# Patient Record
Sex: Female | Born: 1950 | Race: Black or African American | Hispanic: No | Marital: Single | Smoking: Never smoker
Health system: Southern US, Community
[De-identification: ages and names within clinical notes are randomized; demographics above are authoritative.]

## PROBLEM LIST (undated history)

## (undated) DIAGNOSIS — M1990 Unspecified osteoarthritis, unspecified site: Secondary | ICD-10-CM

---

## 2002-06-11 ENCOUNTER — Ambulatory Visit (HOSPITAL_COMMUNITY): Admission: RE | Admit: 2002-06-11 | Discharge: 2002-06-11 | Payer: Self-pay | Admitting: Family Medicine

## 2020-12-22 DIAGNOSIS — M25561 Pain in right knee: Secondary | ICD-10-CM | POA: Diagnosis not present

## 2020-12-22 DIAGNOSIS — M545 Low back pain, unspecified: Secondary | ICD-10-CM | POA: Diagnosis not present

## 2020-12-22 DIAGNOSIS — M1711 Unilateral primary osteoarthritis, right knee: Secondary | ICD-10-CM | POA: Diagnosis not present

## 2020-12-24 ENCOUNTER — Ambulatory Visit (INDEPENDENT_AMBULATORY_CARE_PROVIDER_SITE_OTHER): Payer: PPO

## 2020-12-24 ENCOUNTER — Encounter: Payer: Self-pay | Admitting: Orthopedic Surgery

## 2020-12-24 ENCOUNTER — Ambulatory Visit (INDEPENDENT_AMBULATORY_CARE_PROVIDER_SITE_OTHER): Payer: PPO | Admitting: Orthopedic Surgery

## 2020-12-24 DIAGNOSIS — M25531 Pain in right wrist: Secondary | ICD-10-CM | POA: Diagnosis not present

## 2020-12-24 DIAGNOSIS — L97211 Non-pressure chronic ulcer of right calf limited to breakdown of skin: Secondary | ICD-10-CM | POA: Diagnosis not present

## 2020-12-24 DIAGNOSIS — I83012 Varicose veins of right lower extremity with ulcer of calf: Secondary | ICD-10-CM

## 2020-12-24 DIAGNOSIS — I872 Venous insufficiency (chronic) (peripheral): Secondary | ICD-10-CM | POA: Diagnosis not present

## 2020-12-24 DIAGNOSIS — M25331 Other instability, right wrist: Secondary | ICD-10-CM

## 2020-12-24 NOTE — Progress Notes (Signed)
Office Visit Note   Patient: Tammy Larsen           Date of Birth: 17-Aug-1951           MRN: [id number] Visit Date: 12/24/2020              Requested by: No referring provider defined for this encounter. PCP: No primary care provider on file.  Chief Complaint  Patient presents with  . Right Leg - Open Wound  . Right Wrist - Injury      HPI: Patient is a 69 year old woman who is seen for chronic venous insufficiency ulcer worse on the right leg than the left.  Patient states she has worn compression stockings for years.  Patient also complains of right wrist pain she denies any traumatic injury.  Patient denies a history of gout.   Assessment & Plan: Visit Diagnoses:  1. Pain in right wrist   2. Scapho-lunate dissociation, right   3. Venous stasis ulcer of right calf limited to breakdown of skin with varicose veins (HCC)     Plan: We will place her in a long-arm Velcro splint on the right to support the wrist.  We will apply Dynaflex wraps to both legs follow-up next week to change the wraps with eventually change her into compression stockings.    Follow-Up Instructions: No follow-ups on file.   Ortho Exam  Patient is alert, oriented, no adenopathy, well-dressed, normal affect, normal respiratory effort. Examination the right wrist patient has no active flexion or extension of the wrist.  She does not have any point tenderness around the wrist the scapholunate interval is minimally tender to palpation.  The scaphoid is nontender to palpation.  Examination of both legs patient has chronic venous insufficiency with brawny skin color changes there is a venous ulcer on the medial aspect of the right calf which is 3 cm in diameter there is granulation tissue at the base with no exposed bone or tendon no cellulitis.  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  Patient has a strong dorsalis pedis pulse bilaterally no arterial insufficiency.  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  No images are attached to the encounter.  Labs: No results found for: HGBA1C, ESRSEDRATE, CRP, LABURIC, REPTSTATUS, GRAMSTAIN, CULT, LABORGA   No results found for: ALBUMIN, PREALBUMIN, LABURIC  No results found for: MG No results found for: VD25OH  No results found for: PREALBUMIN No flowsheet data found.   There is no height or weight on file to calculate BMI.  Orders:  Orders Placed This Encounter  Procedures  . XR Wrist 2 Views Right   No orders of the defined types were placed in this encounter.    Procedures: No procedures performed  Clinical Data: No additional findings.  ROS:  All other systems negative, except as noted in the HPI. Review of Systems  Objective: Vital Signs: There were no vitals taken for this visit.  Specialty Comments:  No specialty comments available.  PMFS History: There are no problems to display for this patient.  History reviewed. No pertinent past medical history.  History reviewed. No pertinent family history.  History reviewed. No pertinent surgical history. Social History   Occupational History  . Not on file  Tobacco Use  . Smoking status: Not on file  . Smokeless tobacco: Not on  file  Substance and Sexual Activity  . Alcohol use: Not on file  . Drug use: Not on file  . Sexual activity: Not on file

## 2020-12-31 ENCOUNTER — Other Ambulatory Visit: Payer: Self-pay

## 2020-12-31 ENCOUNTER — Ambulatory Visit: Payer: PPO | Admitting: Physician Assistant

## 2020-12-31 ENCOUNTER — Encounter: Payer: Self-pay | Admitting: Orthopedic Surgery

## 2020-12-31 DIAGNOSIS — M25531 Pain in right wrist: Secondary | ICD-10-CM | POA: Diagnosis not present

## 2020-12-31 NOTE — Progress Notes (Signed)
   Office Visit Note   Patient: Tammy Larsen           Date of Birth: 1951/01/14           MRN: [id number] Visit Date: 12/31/2020              Requested by: No referring provider defined for this encounter. PCP: No primary care provider on file.  Chief Complaint  Patient presents with  . Left Leg - Follow-up  . Right Leg - Follow-up  . Right Wrist - Follow-up  . Lower Back - Wound Check      HPI: Patient is a pleasant 70 year old woman who we have been wrapping bilateral lower extremities.  She is here with her family.  Also has been treated for a scapholunate dissociation on the right.  She has tolerated the wraps well.  She does feel better in the Velcro wrist splint  Assessment & Plan: Visit Diagnoses: No diagnosis found.  Plan: We will be wrapped 1 more time.  Was measured for large vive compression stockings.  We will continue to wear the splint with new x-rays next week.  We will also remove the wraps early next week in place compression stockings on her lower extremities  Follow-Up Instructions: No follow-ups on file.   Ortho Exam  Patient is alert, oriented, no adenopathy, well-dressed, normal affect, normal respiratory effort. Bilateral lower extremities significant decrease in swelling.  She does have some resolving ulcers.  There is no foul odor no drainage.  No signs of necrosis or cellulitis.  Right wrist has some improvement with regards to pain still has limited to no active flexion or extension of the wrist.  No specific tenderness  Imaging: No results found. No images are attached to the encounter.  Labs: No results found for: HGBA1C, ESRSEDRATE, CRP, LABURIC, REPTSTATUS, GRAMSTAIN, CULT, LABORGA   No results found for: ALBUMIN, PREALBUMIN, LABURIC  No results found for: MG No results found for: VD25OH  No results found for: PREALBUMIN No flowsheet data found.   There is no height or weight on file to calculate BMI.  Orders:  No orders of the  defined types were placed in this encounter.  No orders of the defined types were placed in this encounter.    Procedures: No procedures performed  Clinical Data: No additional findings.  ROS:  All other systems negative, except as noted in the HPI. Review of Systems  Objective: Vital Signs: There were no vitals taken for this visit.  Specialty Comments:  No specialty comments available.  PMFS History: There are no problems to display for this patient.  No past medical history on file.  No family history on file.  No past surgical history on file. Social History   Occupational History  . Not on file  Tobacco Use  . Smoking status: Not on file  . Smokeless tobacco: Not on file  Substance and Sexual Activity  . Alcohol use: Not on file  . Drug use: Not on file  . Sexual activity: Not on file

## 2021-01-06 DIAGNOSIS — M1711 Unilateral primary osteoarthritis, right knee: Secondary | ICD-10-CM | POA: Diagnosis not present

## 2021-01-06 DIAGNOSIS — M25561 Pain in right knee: Secondary | ICD-10-CM | POA: Diagnosis not present

## 2021-01-08 ENCOUNTER — Ambulatory Visit: Payer: PPO | Admitting: Physician Assistant

## 2021-01-08 ENCOUNTER — Other Ambulatory Visit: Payer: Self-pay

## 2021-01-08 ENCOUNTER — Encounter: Payer: Self-pay | Admitting: Physician Assistant

## 2021-01-08 ENCOUNTER — Ambulatory Visit: Payer: Self-pay

## 2021-01-08 DIAGNOSIS — M25531 Pain in right wrist: Secondary | ICD-10-CM | POA: Diagnosis not present

## 2021-01-08 NOTE — Progress Notes (Signed)
   Office Visit Note   Patient: Tammy Larsen           Date of Birth: 16-Jul-1951           MRN: [id number] Visit Date: 01/08/2021              Requested by: No referring provider defined for this encounter. PCP: No primary care provider on file.  Chief Complaint  Patient presents with  . Right Leg - Follow-up  . Left Leg - Follow-up  . Right Wrist - Follow-up      HPI: Patient is a pleasant 70 year old woman who follows up today for her bilateral lower extremity venous stasis disease.  Also follows up for her right wrist scapholunate disassociation.  She is wondering if her right wrist injury is related to twisting her wrist when she was using her walker.  She has been in an immobilizer and actually feels much better.  She is right-handed.  She has brought her socks today to be applied  Assessment & Plan: Visit Diagnoses:  1. Pain in right wrist     Plan: Discussed scaphoid lunate dissociation with Dr. Erlinda Hong.  She is not particularly tender today she says her wrist just feels stiff.  We have recommended weaning out of the splint and beginning occupational therapy.  If she had pain a wrist injection could be done.  Surgically the option for her with carpectomy but only a name terribly painful they would like to go forward with occupational therapy.  Follow-up in 3 weeks  Follow-Up Instructions: No follow-ups on file.   Ortho Exam  Patient is alert, oriented, no adenopathy, well-dressed, normal affect, normal respiratory effort. Right wrist nontender to palpation she does have weak grip strength.  No swelling she is able to extend and flex her wrist though moderately stiff.  No soft tissue swelling Bilateral lower extremities no open ulcers significant decrease in swelling with wrinkling of the skin and no cellulitis or signs of infection  Imaging: No results found. No images are attached to the encounter.  Labs: No results found for: HGBA1C, ESRSEDRATE, CRP, LABURIC, REPTSTATUS,  GRAMSTAIN, CULT, LABORGA   No results found for: ALBUMIN, PREALBUMIN, LABURIC  No results found for: MG No results found for: VD25OH  No results found for: PREALBUMIN No flowsheet data found.   There is no height or weight on file to calculate BMI.  Orders:  Orders Placed This Encounter  Procedures  . XR Wrist Complete Right  . Ambulatory referral to Occupational Therapy   No orders of the defined types were placed in this encounter.    Procedures: No procedures performed  Clinical Data: No additional findings.  ROS:  All other systems negative, except as noted in the HPI. Review of Systems  Objective: Vital Signs: There were no vitals taken for this visit.  Specialty Comments:  No specialty comments available.  PMFS History: There are no problems to display for this patient.  No past medical history on file.  No family history on file.  No past surgical history on file. Social History   Occupational History  . Not on file  Tobacco Use  . Smoking status: Not on file  . Smokeless tobacco: Not on file  Substance and Sexual Activity  . Alcohol use: Not on file  . Drug use: Not on file  . Sexual activity: Not on file

## 2021-01-20 DIAGNOSIS — I8390 Asymptomatic varicose veins of unspecified lower extremity: Secondary | ICD-10-CM | POA: Diagnosis not present

## 2021-01-20 DIAGNOSIS — Z9181 History of falling: Secondary | ICD-10-CM | POA: Diagnosis not present

## 2021-01-20 DIAGNOSIS — M545 Low back pain, unspecified: Secondary | ICD-10-CM | POA: Diagnosis not present

## 2021-01-20 DIAGNOSIS — M1711 Unilateral primary osteoarthritis, right knee: Secondary | ICD-10-CM | POA: Diagnosis not present

## 2021-01-27 DIAGNOSIS — Z9181 History of falling: Secondary | ICD-10-CM | POA: Diagnosis not present

## 2021-01-27 DIAGNOSIS — M545 Low back pain, unspecified: Secondary | ICD-10-CM | POA: Diagnosis not present

## 2021-01-27 DIAGNOSIS — I8390 Asymptomatic varicose veins of unspecified lower extremity: Secondary | ICD-10-CM | POA: Diagnosis not present

## 2021-01-27 DIAGNOSIS — M1711 Unilateral primary osteoarthritis, right knee: Secondary | ICD-10-CM | POA: Diagnosis not present

## 2021-01-28 ENCOUNTER — Ambulatory Visit: Payer: PPO | Admitting: Physician Assistant

## 2021-01-28 ENCOUNTER — Encounter: Payer: Self-pay | Admitting: Physician Assistant

## 2021-01-28 DIAGNOSIS — M25531 Pain in right wrist: Secondary | ICD-10-CM | POA: Diagnosis not present

## 2021-01-28 NOTE — Progress Notes (Signed)
   Office Visit Note   Patient: Tammy Larsen           Date of Birth: 1951-02-27           MRN: [id number] Visit Date: 01/28/2021              Requested by: No referring provider defined for this encounter. PCP: No primary care provider on file.  Chief Complaint  Patient presents with  . Right Wrist - Follow-up    F/u for right wrist scapholunate dissociation      HPI: Patient presents in follow-up today for her right wrist scapholunate disassociation.  Also for her bilateral lower extremity venous insufficiency.  She has been wearing compression socks and has noticed improvement.  Her wrist does not really hurt her very much at all.  Family is wondering if she can do home health occupational therapy for the wrist at the same time she does hurt me therapy  Assessment & Plan: Visit Diagnoses: No diagnosis found.  Plan: She is improving we will follow-up in 3 weeks for her lower extremities.  Continue with compression socks.  Will write for occupational therapy at home for her wrist  Follow-Up Instructions: No follow-ups on file.   Ortho Exam  Patient is alert, oriented, no adenopathy, well-dressed, normal affect, normal respiratory effort. Right wrist no swelling she but has good extension flexion good grip strength.  Nontender to palpation. Bilateral lower extremities socks are doing an excellent job debriding skin she has no open ulcers.  No cellulitis decreased swelling no evidence of infection  Imaging: No results found. No images are attached to the encounter.  Labs: No results found for: HGBA1C, ESRSEDRATE, CRP, LABURIC, REPTSTATUS, GRAMSTAIN, CULT, LABORGA   No results found for: ALBUMIN, PREALBUMIN, LABURIC  No results found for: MG No results found for: VD25OH  No results found for: PREALBUMIN No flowsheet data found.   There is no height or weight on file to calculate BMI.  Orders:  No orders of the defined types were placed in this encounter.  No  orders of the defined types were placed in this encounter.    Procedures: No procedures performed  Clinical Data: No additional findings.  ROS:  All other systems negative, except as noted in the HPI. Review of Systems  Objective: Vital Signs: There were no vitals taken for this visit.  Specialty Comments:  No specialty comments available.  PMFS History: There are no problems to display for this patient.  History reviewed. No pertinent past medical history.  History reviewed. No pertinent family history.  History reviewed. No pertinent surgical history. Social History   Occupational History  . Not on file  Tobacco Use  . Smoking status: Not on file  . Smokeless tobacco: Not on file  Substance and Sexual Activity  . Alcohol use: Not on file  . Drug use: Not on file  . Sexual activity: Not on file

## 2021-02-03 ENCOUNTER — Encounter (HOSPITAL_COMMUNITY): Payer: Self-pay

## 2021-02-03 ENCOUNTER — Emergency Department (HOSPITAL_COMMUNITY): Payer: PPO

## 2021-02-03 ENCOUNTER — Inpatient Hospital Stay (HOSPITAL_COMMUNITY)
Admission: EM | Admit: 2021-02-03 | Discharge: 2021-02-19 | DRG: 054 | Disposition: A | Discharge status: Skilled Nursing Facility | Payer: PPO | Attending: Internal Medicine | Admitting: Internal Medicine

## 2021-02-03 DIAGNOSIS — C7951 Secondary malignant neoplasm of bone: Secondary | ICD-10-CM | POA: Diagnosis present

## 2021-02-03 DIAGNOSIS — D72829 Elevated white blood cell count, unspecified: Secondary | ICD-10-CM | POA: Diagnosis not present

## 2021-02-03 DIAGNOSIS — Z20822 Contact with and (suspected) exposure to covid-19: Secondary | ICD-10-CM | POA: Diagnosis present

## 2021-02-03 DIAGNOSIS — M1711 Unilateral primary osteoarthritis, right knee: Secondary | ICD-10-CM | POA: Diagnosis present

## 2021-02-03 DIAGNOSIS — R569 Unspecified convulsions: Secondary | ICD-10-CM | POA: Diagnosis not present

## 2021-02-03 DIAGNOSIS — R5381 Other malaise: Secondary | ICD-10-CM | POA: Diagnosis not present

## 2021-02-03 DIAGNOSIS — C7800 Secondary malignant neoplasm of unspecified lung: Secondary | ICD-10-CM | POA: Diagnosis present

## 2021-02-03 DIAGNOSIS — Z515 Encounter for palliative care: Secondary | ICD-10-CM

## 2021-02-03 DIAGNOSIS — C801 Malignant (primary) neoplasm, unspecified: Secondary | ICD-10-CM | POA: Diagnosis not present

## 2021-02-03 DIAGNOSIS — Z7189 Other specified counseling: Secondary | ICD-10-CM | POA: Diagnosis not present

## 2021-02-03 DIAGNOSIS — N6312 Unspecified lump in the right breast, upper inner quadrant: Secondary | ICD-10-CM | POA: Diagnosis not present

## 2021-02-03 DIAGNOSIS — M6281 Muscle weakness (generalized): Secondary | ICD-10-CM | POA: Diagnosis not present

## 2021-02-03 DIAGNOSIS — C50311 Malignant neoplasm of lower-inner quadrant of right female breast: Secondary | ICD-10-CM | POA: Diagnosis not present

## 2021-02-03 DIAGNOSIS — G9389 Other specified disorders of brain: Secondary | ICD-10-CM

## 2021-02-03 DIAGNOSIS — R0902 Hypoxemia: Secondary | ICD-10-CM | POA: Diagnosis not present

## 2021-02-03 DIAGNOSIS — D638 Anemia in other chronic diseases classified elsewhere: Secondary | ICD-10-CM | POA: Diagnosis not present

## 2021-02-03 DIAGNOSIS — R918 Other nonspecific abnormal finding of lung field: Secondary | ICD-10-CM

## 2021-02-03 DIAGNOSIS — R54 Age-related physical debility: Secondary | ICD-10-CM | POA: Diagnosis present

## 2021-02-03 DIAGNOSIS — Z171 Estrogen receptor negative status [ER-]: Secondary | ICD-10-CM | POA: Diagnosis not present

## 2021-02-03 DIAGNOSIS — R202 Paresthesia of skin: Secondary | ICD-10-CM | POA: Diagnosis not present

## 2021-02-03 DIAGNOSIS — C7981 Secondary malignant neoplasm of breast: Secondary | ICD-10-CM | POA: Diagnosis present

## 2021-02-03 DIAGNOSIS — C7931 Secondary malignant neoplasm of brain: Principal | ICD-10-CM

## 2021-02-03 DIAGNOSIS — R531 Weakness: Secondary | ICD-10-CM | POA: Diagnosis not present

## 2021-02-03 DIAGNOSIS — M1990 Unspecified osteoarthritis, unspecified site: Secondary | ICD-10-CM | POA: Diagnosis not present

## 2021-02-03 DIAGNOSIS — C799 Secondary malignant neoplasm of unspecified site: Secondary | ICD-10-CM

## 2021-02-03 DIAGNOSIS — C50811 Malignant neoplasm of overlapping sites of right female breast: Secondary | ICD-10-CM | POA: Diagnosis present

## 2021-02-03 DIAGNOSIS — N858 Other specified noninflammatory disorders of uterus: Secondary | ICD-10-CM | POA: Diagnosis not present

## 2021-02-03 DIAGNOSIS — C50911 Malignant neoplasm of unspecified site of right female breast: Secondary | ICD-10-CM | POA: Diagnosis not present

## 2021-02-03 DIAGNOSIS — R000 Tachycardia, unspecified: Secondary | ICD-10-CM | POA: Diagnosis not present

## 2021-02-03 DIAGNOSIS — G936 Cerebral edema: Secondary | ICD-10-CM | POA: Diagnosis present

## 2021-02-03 DIAGNOSIS — Z880 Allergy status to penicillin: Secondary | ICD-10-CM

## 2021-02-03 DIAGNOSIS — M159 Polyosteoarthritis, unspecified: Secondary | ICD-10-CM | POA: Diagnosis not present

## 2021-02-03 DIAGNOSIS — T380X5A Adverse effect of glucocorticoids and synthetic analogues, initial encounter: Secondary | ICD-10-CM | POA: Diagnosis not present

## 2021-02-03 DIAGNOSIS — M2550 Pain in unspecified joint: Secondary | ICD-10-CM | POA: Diagnosis not present

## 2021-02-03 DIAGNOSIS — R29810 Facial weakness: Secondary | ICD-10-CM | POA: Diagnosis not present

## 2021-02-03 DIAGNOSIS — R9431 Abnormal electrocardiogram [ECG] [EKG]: Secondary | ICD-10-CM | POA: Diagnosis not present

## 2021-02-03 DIAGNOSIS — Z7401 Bed confinement status: Secondary | ICD-10-CM | POA: Diagnosis not present

## 2021-02-03 HISTORY — DX: Unspecified osteoarthritis, unspecified site: M19.90

## 2021-02-03 LAB — CBC
HCT: 29.6 % — ABNORMAL LOW (ref 36.0–46.0)
HCT: 32.6 % — ABNORMAL LOW (ref 36.0–46.0)
Hemoglobin: 9.4 g/dL — ABNORMAL LOW (ref 12.0–15.0)
Hemoglobin: 10 g/dL — ABNORMAL LOW (ref 12.0–15.0)
MCH: 22.1 pg — ABNORMAL LOW (ref 26.0–34.0)
MCH: 21.7 pg — ABNORMAL LOW (ref 26.0–34.0)
MCHC: 31.8 g/dL (ref 30.0–36.0)
MCHC: 30.7 g/dL (ref 30.0–36.0)
MCV: 69.6 fL — ABNORMAL LOW (ref 80.0–100.0)
MCV: 70.7 fL — ABNORMAL LOW (ref 80.0–100.0)
Platelets: 289 10*3/uL (ref 150–400)
Platelets: 324 10*3/uL (ref 150–400)
RBC: 4.25 MIL/uL (ref 3.87–5.11)
RBC: 4.61 MIL/uL (ref 3.87–5.11)
RDW: 15.8 % — ABNORMAL HIGH (ref 11.5–15.5)
RDW: 15.9 % — ABNORMAL HIGH (ref 11.5–15.5)
WBC: 8.4 10*3/uL (ref 4.0–10.5)
WBC: 8.1 10*3/uL (ref 4.0–10.5)
nRBC: 0 % (ref 0.0–0.2)
nRBC: 0 % (ref 0.0–0.2)

## 2021-02-03 LAB — BASIC METABOLIC PANEL
Anion gap: 13 (ref 5–15)
BUN: 13 mg/dL (ref 8–23)
CO2: 26 mmol/L (ref 22–32)
Calcium: 9.5 mg/dL (ref 8.9–10.3)
Chloride: 101 mmol/L (ref 98–111)
Creatinine, Ser: 0.76 mg/dL (ref 0.44–1.00)
GFR, Estimated: >60 mL/min (ref >60)
Glucose, Bld: 105 mg/dL — ABNORMAL HIGH (ref 70–99)
Potassium: 4.1 mmol/L (ref 3.5–5.1)
Sodium: 140 mmol/L (ref 135–145)

## 2021-02-03 LAB — CREATININE, SERUM
Creatinine, Ser: 0.69 mg/dL (ref 0.44–1.00)
GFR, Estimated: >60 mL/min (ref >60)

## 2021-02-03 MED ORDER — ONDANSETRON HCL 4 MG/2ML IJ SOLN: 4.0000 mg | Freq: Four times a day (QID) | INTRAMUSCULAR | Status: DC | PRN

## 2021-02-03 MED ORDER — DEXAMETHASONE SODIUM PHOSPHATE 10 MG/ML IJ SOLN
10.0000 mg | Freq: Once | INTRAMUSCULAR | Status: AC
  Administered 2021-02-03: 10 mg via INTRAVENOUS
  Filled 2021-02-03: qty 1

## 2021-02-03 MED ORDER — IOHEXOL 300 MG/ML  SOLN
75.0000 mL | Freq: Once | INTRAMUSCULAR | Status: AC | PRN
  Administered 2021-02-03: 75 mL via INTRAVENOUS

## 2021-02-03 MED ORDER — LEVETIRACETAM IN NACL 1000 MG/100ML IV SOLN
1000.0000 mg | Freq: Once | INTRAVENOUS | Status: AC
  Administered 2021-02-03: 1000 mg via INTRAVENOUS
  Filled 2021-02-03: qty 100

## 2021-02-03 MED ORDER — ACETAMINOPHEN 325 MG PO TABS
650.0000 mg | ORAL_TABLET | Freq: Four times a day (QID) | ORAL | Status: DC | PRN
  Administered 2021-02-12 – 2021-02-16 (×2): 650 mg via ORAL
  Filled 2021-02-03 (×2): qty 2

## 2021-02-03 MED ORDER — GADOBUTROL 1 MMOL/ML IV SOLN
7.0000 mL | Freq: Once | INTRAVENOUS | Status: AC | PRN
  Administered 2021-02-03: 7 mL via INTRAVENOUS

## 2021-02-03 MED ORDER — ENOXAPARIN SODIUM 40 MG/0.4ML ~~LOC~~ SOLN
40.0000 mg | SUBCUTANEOUS | Status: DC
  Administered 2021-02-04: 40 mg via SUBCUTANEOUS
  Filled 2021-02-03: qty 0.4

## 2021-02-03 MED ORDER — LEVETIRACETAM 500 MG PO TABS
500.0000 mg | ORAL_TABLET | Freq: Two times a day (BID) | ORAL | Status: DC
  Administered 2021-02-04 – 2021-02-19 (×31): 500 mg via ORAL
  Filled 2021-02-03 (×31): qty 1

## 2021-02-03 MED ORDER — SODIUM CHLORIDE 0.9 % IV SOLN: 2000.0000 mg | Freq: Once | INTRAVENOUS | Status: DC

## 2021-02-03 MED ORDER — DEXAMETHASONE SODIUM PHOSPHATE 4 MG/ML IJ SOLN
4.0000 mg | Freq: Four times a day (QID) | INTRAMUSCULAR | Status: DC
  Administered 2021-02-03 – 2021-02-17 (×55): 4 mg via INTRAVENOUS
  Filled 2021-02-03 (×55): qty 1

## 2021-02-03 MED ORDER — ONDANSETRON HCL 4 MG PO TABS: 4.0000 mg | ORAL_TABLET | Freq: Four times a day (QID) | ORAL | Status: DC | PRN

## 2021-02-03 MED ORDER — LEVETIRACETAM 500 MG PO TABS
500.0000 mg | ORAL_TABLET | Freq: Two times a day (BID) | ORAL | Status: DC
  Administered 2021-02-03: 500 mg via ORAL
  Filled 2021-02-03: qty 1

## 2021-02-03 MED ORDER — DEXTROSE-NACL 5-0.45 % IV SOLN: INTRAVENOUS | Status: DC

## 2021-02-03 NOTE — ED Triage Notes (Signed)
Pt from home with ems, pt was doing physical therapy at home when they noticed 1-2 mins of seizure like activity. PT reports pts right arm, leg and face was switching, pt was alert throughout the whole episode. No hx of seizures. Pt also having worsening generalized weakness over the past few weeks, pt unable to walk now due to weakness. Pt a.o upon arrival to ED, VSS

## 2021-02-03 NOTE — ED Provider Notes (Signed)
[facility] EMERGENCY DEPARTMENT Provider Note  CSN: [id number] Arrival date & time: 02/03/21 1200    History Chief Complaint  Patient presents with  . Seizures  . Weakness    HPI  Tammy Larsen is a 70 y.o. female with no significant medical history, does not go to the doctor much. She does not take any medications. She has been doing home PT for R knee arthritis and today while with the therapist, she had what is described as seizure-like activity of her R arm and leg. She was awake during this episode. She did not have any loss of bowel or bladder control. Symptoms lasted approximately 2 minutes. She denies any symptoms now but she does admit to feeling weak recently. She typically uses a walker to get around but is now not able to walk. She denies any fever, night sweats, weight loss, CP, SOB, N/V/D or dysuria. She lives with her younger brother.    History reviewed. No pertinent past medical history.  History reviewed. No pertinent surgical history.  No family history on file.      Home Medications Prior to Admission medications   Not on File     Allergies    Patient has no allergy information on record.   Review of Systems   Review of Systems A comprehensive review of systems was completed and negative except as noted in HPI.    Physical Exam BP (!) 128/94   Pulse 96   Temp 98.9 F (37.2 C) (Oral)   Resp (!) 21   SpO2 100%   Physical Exam Vitals and nursing note reviewed.  Constitutional:      Appearance: Normal appearance.  HENT:     Head: Normocephalic and atraumatic.     Nose: Nose normal.     Mouth/Throat:     Mouth: Mucous membranes are moist.  Eyes:     Extraocular Movements: Extraocular movements intact.     Conjunctiva/sclera: Conjunctivae normal.  Cardiovascular:     Rate and Rhythm: Normal rate.  Pulmonary:     Effort: Pulmonary effort is normal.     Breath sounds: Normal breath sounds.  Abdominal:     General: Abdomen is flat.      Palpations: Abdomen is soft.     Tenderness: There is no abdominal tenderness.  Musculoskeletal:        General: No swelling. Normal range of motion.     Cervical back: Neck supple.  Skin:    General: Skin is warm and dry.     Comments: Chronic appearing skin changes of BLE with thick skin plaques and flaking dry skin, no ulcers or signs of infection  Neurological:     General: No focal deficit present.     Mental Status: She is alert.     Comments: Bilateral LE weakness, R>L  Psychiatric:        Mood and Affect: Mood normal.      ED Results / Procedures / Treatments   Labs (all labs ordered are listed, but only abnormal results are displayed) Labs Reviewed  BASIC METABOLIC PANEL - Abnormal; Notable for the following components:      Result Value   Glucose, Bld 105 (*)    All other components within normal limits  CBC - Abnormal; Notable for the following components:   Hemoglobin 9.4 (*)    HCT 29.6 (*)    MCV 69.6 (*)    MCH 22.1 (*)    RDW 15.8 (*)  All other components within normal limits    EKG EKG Interpretation  Date/Time:  Wednesday February 03 2021 12:08:31 EST Ventricular Rate:  95 PR Interval:  88 QRS Duration: 80 QT Interval:  332 QTC Calculation: 417 R Axis:   43 Text Interpretation: Sinus rhythm with short PR Nonspecific T wave abnormality Abnormal ECG No old tracing to compare Confirmed by Calvert Cantor [phone]) on 02/03/2021 4:29:26 PM   Radiology CT Head Wo Contrast  Result Date: 02/03/2021 CLINICAL DATA:  Generalized weakness this morning. Numbness and tingling in the hands. No reported injury. EXAM: CT HEAD WITHOUT CONTRAST TECHNIQUE: Contiguous axial images were obtained from the base of the skull through the vertex without intravenous contrast. COMPARISON:  None. FINDINGS: Brain: There are similar bilateral frontal lobe masses measuring 2.1 cm on the right (series 3/image 16) and 2.6 cm medially and superiorly on the left (series 3/image 23)  with central low-attenuation and soft tissue rim with surrounding prominent bilateral frontal and right temporal white matter hypoattenuation. There is associated bilateral frontal and right temporal sulcal effacement. Scattered small regions of loss of gray-white matter differentiation are noted in bilateral frontal and right temporal lobes. Low-attenuation focus centrally in the right cerebellar hemisphere without significant mass effect. No evidence of parenchymal hemorrhage or extra-axial fluid collection. No midline shift. Cerebral volume is age appropriate. No ventriculomegaly. Vascular: No acute abnormality. Skull: No evidence of calvarial fracture. Sinuses/Orbits: The visualized paranasal sinuses are essentially clear. Other:  The mastoid air cells are unopacified. IMPRESSION: 1. Bilateral frontal lobe masses, with surrounding prominent bilateral frontal and right temporal white matter greater than cortical hypoattenuation and associated sulcal effacement. Low-attenuation central right cerebellar hemisphere focus. Findings are suspicious for metastatic disease. MRI brain without and with IV contrast is recommended for further evaluation. 2. No evidence of acute intracranial hemorrhage. Critical Value/emergent results were called by telephone at the time of interpretation on 02/03/2021 at 1:18 pm to provider [facility] , who verbally acknowledged these results. Electronically Signed   By: Ilona Sorrel M.D.   On: 02/03/2021 13:22   MR Brain W and Wo Contrast  Result Date: 02/03/2021 CLINICAL DATA:  Metastatic disease follow-up EXAM: MRI HEAD WITHOUT AND WITH CONTRAST TECHNIQUE: Multiplanar, multiecho pulse sequences of the brain and surrounding structures were obtained without and with intravenous contrast. CONTRAST:  80mL GADAVIST GADOBUTROL 1 MMOL/ML IV SOLN COMPARISON:  None. FINDINGS: Brain: There are multiple intracranial metastatic lesions within both hemispheres and in the right cerebellar  hemisphere. There is a small amount of associated with the right cerebellar lesion only. No intrinsic hyperintense T1-weighted signal. There is moderate edema surrounding most of the lesions, worst surrounding the lesions that are numbered 11, 9, 4 and 5 below. Lesion sizes and locations: 1. Right cerebellum, 1.4 cm, series 11, image 9 2. Medial right temporal lobe 1.1 cm image 17 3. Inferior left frontal lobe 1.3 cm image 20 4. Right frontal operculum 1.4 cm image 24 5. Posterior right frontal operculum 1.5 cm image 24 6. Left occipital lobe 1.4 cm image 25 7. Left frontal lobe 0.6 cm image 23 8. Lateral left parietal lobe 1.4 cm image 28 9. Right frontal white matter 2.2 cm image 27 10. Right frontal periventricular white matter 1.1 cm image 28 11. Paramedian left frontal lobe 3.2 cm image 35 12. Right parietal lobe 0.8 cm image 33 13. Lateral right parietal lobe 2.0 cm image 37 14. Posteroinferior left lentiform nucleus 0.3 cm image 18 There is no midline shift, hydrocephalus  or significant mass effect. Vascular: Normal flow voids. Skull and upper cervical spine: Normal marrow signal. Sinuses/Orbits: Negative. Other: None. IMPRESSION: Multiple intracranial metastatic lesions, the largest of which measures up to 3.2 cm. Moderate multifocal edema without midline shift or significant mass effect. Electronically Signed   By: Ulyses Jarred M.D.   On: 02/03/2021 19:35   CT CHEST ABDOMEN PELVIS W CONTRAST  Result Date: 02/03/2021 CLINICAL DATA:  Seizure. Abnormal head CT suspicious for brain metastases. EXAM: CT CHEST, ABDOMEN, AND PELVIS WITH CONTRAST TECHNIQUE: Multidetector CT imaging of the chest, abdomen and pelvis was performed following the standard protocol during bolus administration of intravenous contrast. CONTRAST:  35mL OMNIPAQUE IOHEXOL 300 MG/ML  SOLN COMPARISON:  None. FINDINGS: CT CHEST FINDINGS Cardiovascular: Normal heart size. No significant pericardial effusion/thickening. Atherosclerotic  nonaneurysmal thoracic aorta. Top-normal caliber main pulmonary artery (3.2 cm diameter). No central pulmonary emboli. Mediastinum/Nodes: No discrete thyroid nodules. Unremarkable esophagus. No pathologically enlarged axillary, mediastinal or hilar lymph nodes. Lungs/Pleura: No pneumothorax. No pleural effusion. No acute consolidative airspace disease. Numerous (at least 8) solid irregular pulmonary nodules scattered throughout the lungs bilaterally, largest 2.0 cm in the right upper lobe (series 5/image 42), 2.1 cm in the left lower lobe (series 5/image 78) and 1.4 cm in the left upper lobe (series 5/image 42). Musculoskeletal: Moderate thoracic spondylosis. Mild anterior T8 vertebral compression fracture of uncertain chronicity, chronic appearing. Healing subacute posterior right eleventh rib fracture without associated discrete osseous lesion. Mildly expansile mixed lytic and sclerotic anterior left sixth rib lesion (series 5/image 100). Inner right breast irregular 3.0 x 1.9 cm soft tissue mass (series 3/image 36). CT ABDOMEN PELVIS FINDINGS Hepatobiliary: Normal liver with no liver mass. Normal gallbladder with no radiopaque cholelithiasis. No biliary ductal dilatation. Pancreas: Normal, with no mass or duct dilation. Spleen: Normal size. No mass. Adrenals/Urinary Tract: Normal adrenals. Normal kidneys with no hydronephrosis and no renal mass. Normal bladder. Stomach/Bowel: Normal non-distended stomach. Normal caliber small bowel with no small bowel wall thickening. Normal candidate appendix. Questionable focal annular wall thickening versus under distention in the ascending colon (series 3/image 73). Otherwise normal large bowel. Vascular/Lymphatic: Atherosclerotic nonaneurysmal abdominal aorta. Patent portal, splenic, hepatic and renal veins. No pathologically enlarged lymph nodes in the abdomen or pelvis. Reproductive: Enlarged uterus with coarse scattered internal calcifications and multiple uterine masses,  largest 5.6 cm anteriorly, presumably uterine fibroids. No adnexal masses. Other: No pneumoperitoneum, ascites or focal fluid collection. Musculoskeletal: No aggressive appearing focal osseous lesions. Marked lumbar spondylosis. IMPRESSION: 1. Numerous (at least 8) solid irregular pulmonary nodules scattered throughout the lungs bilaterally, largest 2.0 cm in the right upper lobe, compatible with pulmonary metastases. 2. Inner right breast irregular 3.0 x 1.9 cm soft tissue mass, cannot exclude primary breast malignancy. Diagnostic mammographic evaluation recommended at a women's imaging center. 3. Small mildly expansile mixed lytic and sclerotic anterior left sixth rib lesion, cannot exclude osseous metastasis. Healing subacute posterior right eleventh rib fracture without associated discrete osseous lesion. Mild anterior T8 vertebral compression fracture of uncertain chronicity, chronic appearing. 4. No evidence of metastatic disease in the abdomen or pelvis. 5. Questionable focal annular wall thickening versus underdistention in the ascending colon. Correlation with the patient's colon cancer screening history is recommended. If screening is not up-to-date, appropriate screening should be considered. 6. Enlarged uterus with multiple uterine masses, presumably uterine fibroids. 7. Aortic Atherosclerosis (ICD10-I70.0). Electronically Signed   By: Ilona Sorrel M.D.   On: 02/03/2021 17:46    Procedures Procedures  Medications Ordered in  the ED Medications  levETIRAcetam (KEPPRA) tablet 500 mg (has no administration in time range)  dexamethasone (DECADRON) injection 4 mg (4 mg Intravenous Not Given 02/03/21 1945)  iohexol (OMNIPAQUE) 300 MG/ML solution 75 mL (75 mLs Intravenous Contrast Given 02/03/21 1718)  dexamethasone (DECADRON) injection 10 mg (10 mg Intravenous Given 02/03/21 1942)  levETIRAcetam (KEPPRA) IVPB 1000 mg/100 mL premix (1,000 mg Intravenous New Bag/Given 02/03/21 1946)    And  levETIRAcetam  (KEPPRA) IVPB 1000 mg/100 mL premix (1,000 mg Intravenous New Bag/Given 02/03/21 1945)  gadobutrol (GADAVIST) 1 MMOL/ML injection 7 mL (7 mLs Intravenous Contrast Given 02/03/21 1857)     MDM Rules/Calculators/A&P MDM CT images reviewed, discussed these results with the patient including concern for malignancy. She will agree to stay for additional scans, but has indicated she does not want to be admitted or stay overnight. I asked her to discuss this decision with family and that it will be hard to continue her workup as an outpatient since she does not have any established medical doctors in the community.   ED Course  I have reviewed the triage vital signs and the nursing notes.  Pertinent labs & imaging results that were available during my care of the patient were reviewed by me and considered in my medical decision making (see chart for details).  Clinical Course as of 02/03/21 2036  Wed Feb 03, 2021  1702 CBC with mild anemia. BMP is unremarkable.  [CS]  0045 CT images and results reviewed, concerning for metastatic breast cancer.  [CS]  9977 Breast Exam: Chaperone present, hard nontender mass in R breast corresponds to findings on CT. Patient informed of the concern for metastatic breast cancer and will agree to admission. Neuro consulted, unassigned admission paged.  [CS]  4142 Spoke with Dr. Jonelle Sidle, Hospitalist, who will evaluate for admission.  [CS]  1823 Spoke with Dr. Cheral Marker who has reviewed CT and recommend Keppra load and then BID dosing, decadron 10mg  load and 4mg  q6 hours and an EEG.  [CS]    Clinical Course User Index [CS] Truddie Hidden, MD    Final Clinical Impression(s) / ED Diagnoses Final diagnoses:  Metastatic cancer to brain [facility])  Partial seizure [facility])  Metastatic malignant neoplasm, unspecified site [facility])    Rx / DC Orders ED Discharge Orders    None       Truddie Hidden, MD 02/03/21 2036

## 2021-02-03 NOTE — H&P (Signed)
History and Physical   Tammy Larsen ZOX:[id number] DOB: 14-Nov-1951 DOA: 02/03/2021  Referring MD/NP/PA: Dr. Karle Starch  PCP: Pcp, No   Outpatient Specialists: None  Patient coming from: Home  Chief Complaint: Seizure and weakness  HPI: Tammy Larsen is a 69 y.o. female with medical history significant of osteoarthritis otherwise no significant past medical history.  Patient does home physical therapy and while doing session today for her right knee with the physical therapist she had a seizure-like activity of her right arm and leg.  She did not lose consciousness.  She did not lose any bowel or bladder control.  She was having these activity for more than 2 minutes.  She then reported generalized weakness.  Patient has been moving around with her walker.  But since the episode has not been able to walk.  No other significant complaint.  She normally does not see physicians and has been pretty healthy per her account.  She was brought to the ER where she was evaluated.  Patient found to have multiple brain masses of unknown primary.  No prior history of cancer.  Denied any recent significant weight loss.  She is notably anemic with hemoglobin 9.4 but appears to be chronic.  Neurology was consulted.  Patient being admitted for observation and started on Keppra and Decadron.  She also get an EEG.  Patient will need evaluation for primary source of cancer..  ED Course: Temperature 98.9 blood pressure 120/94 pulse 99 respirate 21 oxygen sat 100% on room air.  CBC and chemistry largely within normal except for hemoglobin 9.4.  CT abdomen and pelvis shows multiple lung metastasis, also 3 x 1.9 cm soft tissue mass in the right breast.  Small lytic and sclerotic lesion in the left sixth rib.  No evidence of metastatic disease in the abdomen or pelvis and evidence of uterine fibroids and possible: Lesion.  Head CT without contrast showed bilateral frontal lobe masses with surrounding prominent bilateral frontal and  right temporal white matter greater than cortical hypoattenuation.  No intracranial hemorrhage.  MRI of the brain showed multiple intracranial metastatic lesions largest about 3.2 cm.  There is moderate multifocal edema but no midline shift or significant mass-effect.  Patient has been initiated on Keppra and is being admitted to the hospital for evaluation and treatment.  Review of Systems: As per HPI otherwise 10 point review of systems negative.    History reviewed. No pertinent past medical history.  History reviewed. No pertinent surgical history.   has no history on file for tobacco use, alcohol use, and drug use.  Not on File  No family history on file.   Prior to Admission medications   Not on File    Physical Exam: Vitals:   02/03/21 1219 02/03/21 1645  BP: 125/82 (!) 128/94  Pulse: 99 96  Resp: 20 (!) 21  Temp: 98.9 F (37.2 C)   TempSrc: Oral   SpO2: 100% 100%      Constitutional: Acutely ill looking, no distress Vitals:   02/03/21 1219 02/03/21 1645  BP: 125/82 (!) 128/94  Pulse: 99 96  Resp: 20 (!) 21  Temp: 98.9 F (37.2 C)   TempSrc: Oral   SpO2: 100% 100%   Eyes: PERRL, lids and conjunctivae normal ENMT: Mucous membranes are moist. Posterior pharynx clear of any exudate or lesions.Normal dentition.  Neck: normal, supple, no masses, no thyromegaly Respiratory:  coarse breath sound, no wheeze or rales. Normal respiratory effort. No accessory muscle use.  Cardiovascular: Regular rate  and rhythm, no murmurs / rubs / gallops. No extremity edema. 2+ pedal pulses. No carotid bruits.  Abdomen: no tenderness, no masses palpated. No hepatosplenomegaly. Bowel sounds positive.  Musculoskeletal: no clubbing / cyanosis. No joint deformity upper and lower extremities. Good ROM, no contractures. Normal muscle tone.  Skin: Coarse and dry skin no rashes, lesions, ulcers. No induration Neurologic: CN 2-12 grossly intact. Sensation intact, DTR normal.   Right lower  extremity and upper extremity power 4 out of 5 otherwise stable Psychiatric: Normal judgment and insight. Alert and oriented x 3. Normal mood.     Labs on Admission: I have personally reviewed following labs and imaging studies  CBC: Recent Labs  Lab 02/03/21 1204  WBC 8.4  HGB 9.4*  HCT 29.6*  MCV 69.6*  PLT 962   Basic Metabolic Panel: Recent Labs  Lab 02/03/21 1204  NA 140  K 4.1  CL 101  CO2 26  GLUCOSE 105*  BUN 13  CREATININE 0.76  CALCIUM 9.5   GFR: CrCl cannot be calculated (Unknown ideal weight.). Liver Function Tests: No results for input(s): AST, ALT, ALKPHOS, BILITOT, PROT, ALBUMIN in the last 168 hours. No results for input(s): LIPASE, AMYLASE in the last 168 hours. No results for input(s): AMMONIA in the last 168 hours. Coagulation Profile: No results for input(s): INR, PROTIME in the last 168 hours. Cardiac Enzymes: No results for input(s): CKTOTAL, CKMB, CKMBINDEX, TROPONINI in the last 168 hours. BNP (last 3 results) No results for input(s): PROBNP in the last 8760 hours. HbA1C: No results for input(s): HGBA1C in the last 72 hours. CBG: No results for input(s): GLUCAP in the last 168 hours. Lipid Profile: No results for input(s): CHOL, HDL, LDLCALC, TRIG, CHOLHDL, LDLDIRECT in the last 72 hours. Thyroid Function Tests: No results for input(s): TSH, T4TOTAL, FREET4, T3FREE, THYROIDAB in the last 72 hours. Anemia Panel: No results for input(s): VITAMINB12, FOLATE, FERRITIN, TIBC, IRON, RETICCTPCT in the last 72 hours. Urine analysis: No results found for: COLORURINE, APPEARANCEUR, LABSPEC, PHURINE, GLUCOSEU, HGBUR, BILIRUBINUR, KETONESUR, PROTEINUR, UROBILINOGEN, NITRITE, LEUKOCYTESUR Sepsis Labs: @LABRCNTIP (procalcitonin:4,lacticidven:4) )No results found for this or any previous visit (from the past 240 hour(s)).   Radiological Exams on Admission: CT Head Wo Contrast  Result Date: 02/03/2021 CLINICAL DATA:  Generalized weakness this  morning. Numbness and tingling in the hands. No reported injury. EXAM: CT HEAD WITHOUT CONTRAST TECHNIQUE: Contiguous axial images were obtained from the base of the skull through the vertex without intravenous contrast. COMPARISON:  None. FINDINGS: Brain: There are similar bilateral frontal lobe masses measuring 2.1 cm on the right (series 3/image 16) and 2.6 cm medially and superiorly on the left (series 3/image 23) with central low-attenuation and soft tissue rim with surrounding prominent bilateral frontal and right temporal white matter hypoattenuation. There is associated bilateral frontal and right temporal sulcal effacement. Scattered small regions of loss of gray-white matter differentiation are noted in bilateral frontal and right temporal lobes. Low-attenuation focus centrally in the right cerebellar hemisphere without significant mass effect. No evidence of parenchymal hemorrhage or extra-axial fluid collection. No midline shift. Cerebral volume is age appropriate. No ventriculomegaly. Vascular: No acute abnormality. Skull: No evidence of calvarial fracture. Sinuses/Orbits: The visualized paranasal sinuses are essentially clear. Other:  The mastoid air cells are unopacified. IMPRESSION: 1. Bilateral frontal lobe masses, with surrounding prominent bilateral frontal and right temporal white matter greater than cortical hypoattenuation and associated sulcal effacement. Low-attenuation central right cerebellar hemisphere focus. Findings are suspicious for metastatic disease. MRI brain without  and with IV contrast is recommended for further evaluation. 2. No evidence of acute intracranial hemorrhage. Critical Value/emergent results were called by telephone at the time of interpretation on 02/03/2021 at 1:18 pm to provider [facility] , who verbally acknowledged these results. Electronically Signed   By: Ilona Sorrel M.D.   On: 02/03/2021 13:22   MR Brain W and Wo Contrast  Result Date: 02/03/2021 CLINICAL  DATA:  Metastatic disease follow-up EXAM: MRI HEAD WITHOUT AND WITH CONTRAST TECHNIQUE: Multiplanar, multiecho pulse sequences of the brain and surrounding structures were obtained without and with intravenous contrast. CONTRAST:  29mL GADAVIST GADOBUTROL 1 MMOL/ML IV SOLN COMPARISON:  None. FINDINGS: Brain: There are multiple intracranial metastatic lesions within both hemispheres and in the right cerebellar hemisphere. There is a small amount of associated with the right cerebellar lesion only. No intrinsic hyperintense T1-weighted signal. There is moderate edema surrounding most of the lesions, worst surrounding the lesions that are numbered 11, 9, 4 and 5 below. Lesion sizes and locations: 1. Right cerebellum, 1.4 cm, series 11, image 9 2. Medial right temporal lobe 1.1 cm image 17 3. Inferior left frontal lobe 1.3 cm image 20 4. Right frontal operculum 1.4 cm image 24 5. Posterior right frontal operculum 1.5 cm image 24 6. Left occipital lobe 1.4 cm image 25 7. Left frontal lobe 0.6 cm image 23 8. Lateral left parietal lobe 1.4 cm image 28 9. Right frontal white matter 2.2 cm image 27 10. Right frontal periventricular white matter 1.1 cm image 28 11. Paramedian left frontal lobe 3.2 cm image 35 12. Right parietal lobe 0.8 cm image 33 13. Lateral right parietal lobe 2.0 cm image 37 14. Posteroinferior left lentiform nucleus 0.3 cm image 18 There is no midline shift, hydrocephalus or significant mass effect. Vascular: Normal flow voids. Skull and upper cervical spine: Normal marrow signal. Sinuses/Orbits: Negative. Other: None. IMPRESSION: Multiple intracranial metastatic lesions, the largest of which measures up to 3.2 cm. Moderate multifocal edema without midline shift or significant mass effect. Electronically Signed   By: Ulyses Jarred M.D.   On: 02/03/2021 19:35   CT CHEST ABDOMEN PELVIS W CONTRAST  Result Date: 02/03/2021 CLINICAL DATA:  Seizure. Abnormal head CT suspicious for brain metastases. EXAM: CT  CHEST, ABDOMEN, AND PELVIS WITH CONTRAST TECHNIQUE: Multidetector CT imaging of the chest, abdomen and pelvis was performed following the standard protocol during bolus administration of intravenous contrast. CONTRAST:  78mL OMNIPAQUE IOHEXOL 300 MG/ML  SOLN COMPARISON:  None. FINDINGS: CT CHEST FINDINGS Cardiovascular: Normal heart size. No significant pericardial effusion/thickening. Atherosclerotic nonaneurysmal thoracic aorta. Top-normal caliber main pulmonary artery (3.2 cm diameter). No central pulmonary emboli. Mediastinum/Nodes: No discrete thyroid nodules. Unremarkable esophagus. No pathologically enlarged axillary, mediastinal or hilar lymph nodes. Lungs/Pleura: No pneumothorax. No pleural effusion. No acute consolidative airspace disease. Numerous (at least 8) solid irregular pulmonary nodules scattered throughout the lungs bilaterally, largest 2.0 cm in the right upper lobe (series 5/image 42), 2.1 cm in the left lower lobe (series 5/image 78) and 1.4 cm in the left upper lobe (series 5/image 42). Musculoskeletal: Moderate thoracic spondylosis. Mild anterior T8 vertebral compression fracture of uncertain chronicity, chronic appearing. Healing subacute posterior right eleventh rib fracture without associated discrete osseous lesion. Mildly expansile mixed lytic and sclerotic anterior left sixth rib lesion (series 5/image 100). Inner right breast irregular 3.0 x 1.9 cm soft tissue mass (series 3/image 36). CT ABDOMEN PELVIS FINDINGS Hepatobiliary: Normal liver with no liver mass. Normal gallbladder with no radiopaque cholelithiasis. No  biliary ductal dilatation. Pancreas: Normal, with no mass or duct dilation. Spleen: Normal size. No mass. Adrenals/Urinary Tract: Normal adrenals. Normal kidneys with no hydronephrosis and no renal mass. Normal bladder. Stomach/Bowel: Normal non-distended stomach. Normal caliber small bowel with no small bowel wall thickening. Normal candidate appendix. Questionable focal  annular wall thickening versus under distention in the ascending colon (series 3/image 73). Otherwise normal large bowel. Vascular/Lymphatic: Atherosclerotic nonaneurysmal abdominal aorta. Patent portal, splenic, hepatic and renal veins. No pathologically enlarged lymph nodes in the abdomen or pelvis. Reproductive: Enlarged uterus with coarse scattered internal calcifications and multiple uterine masses, largest 5.6 cm anteriorly, presumably uterine fibroids. No adnexal masses. Other: No pneumoperitoneum, ascites or focal fluid collection. Musculoskeletal: No aggressive appearing focal osseous lesions. Marked lumbar spondylosis. IMPRESSION: 1. Numerous (at least 8) solid irregular pulmonary nodules scattered throughout the lungs bilaterally, largest 2.0 cm in the right upper lobe, compatible with pulmonary metastases. 2. Inner right breast irregular 3.0 x 1.9 cm soft tissue mass, cannot exclude primary breast malignancy. Diagnostic mammographic evaluation recommended at a women's imaging center. 3. Small mildly expansile mixed lytic and sclerotic anterior left sixth rib lesion, cannot exclude osseous metastasis. Healing subacute posterior right eleventh rib fracture without associated discrete osseous lesion. Mild anterior T8 vertebral compression fracture of uncertain chronicity, chronic appearing. 4. No evidence of metastatic disease in the abdomen or pelvis. 5. Questionable focal annular wall thickening versus underdistention in the ascending colon. Correlation with the patient's colon cancer screening history is recommended. If screening is not up-to-date, appropriate screening should be considered. 6. Enlarged uterus with multiple uterine masses, presumably uterine fibroids. 7. Aortic Atherosclerosis (ICD10-I70.0). Electronically Signed   By: Ilona Sorrel M.D.   On: 02/03/2021 17:46    EKG: Independently reviewed.  Shows normal sinus rhythm with a rate of 95.  Relatively normal intervals with nonspecific ST  changes.  Assessment/Plan Principal Problem:   Frontal mass of brain Active Problems:   Seizure (HCC)   Osteoarthritis     #1 metastatic lesions of the brain: Primary likely to be breast or lung.  Patient has significant uterine masses as well which could be the primary.  Patient will be initiated on Keppra due to the seizure as well as Decadron.  She will need full evaluation for primary.  May require biopsy either of the lung tissue or the breast tissue.  #2 seizure activity: She had partial seizure involving the right arm most likely from her brain metastasis.  Continue with Keppra  #3 multiple long and breasts masses: Patient appears to have metastatic disease to these areas.  At this point primary malignancy will have to be found with biopsy.  Diagnostic mammogram recommended but tissue is obvious may need fine-needle aspiration prior to discharge.   DVT prophylaxis: Lovenox Code Status: Full code Family Communication: Junior brother Disposition Plan: To be determined Consults called: Dr. Cheral Marker of neurology Admission status: Observation  Severity of Illness: The appropriate patient status for this patient is OBSERVATION. Observation status is judged to be reasonable and necessary in order to provide the required intensity of service to ensure the patient's safety. The patient's presenting symptoms, physical exam findings, and initial radiographic and laboratory data in the context of their medical condition is felt to place them at decreased risk for further clinical deterioration. Furthermore, it is anticipated that the patient will be medically stable for discharge from the hospital within 2 midnights of admission. The following factors support the patient status of observation.   " The patient's  presenting symptoms include seizure-like activity. " The physical exam findings include large right breast mass. " The initial radiographic and laboratory data are CT and MRI findings  of metastatic disease.     Barbette Merino MD [facility] Pager [phone]  If 7PM-7AM, please contact night-coverage [email or web address] Password [facility]  02/03/2021, 9:55 PM

## 2021-02-04 ENCOUNTER — Ambulatory Visit
Admit: 2021-02-04 | Discharge: 2021-02-04 | Disposition: A | Discharge status: Home / Self Care | Payer: PPO | Attending: Radiation Oncology | Admitting: Radiation Oncology

## 2021-02-04 ENCOUNTER — Encounter (HOSPITAL_COMMUNITY): Payer: Self-pay | Admitting: Internal Medicine

## 2021-02-04 DIAGNOSIS — C7931 Secondary malignant neoplasm of brain: Secondary | ICD-10-CM

## 2021-02-04 DIAGNOSIS — C799 Secondary malignant neoplasm of unspecified site: Secondary | ICD-10-CM

## 2021-02-04 DIAGNOSIS — R918 Other nonspecific abnormal finding of lung field: Secondary | ICD-10-CM

## 2021-02-04 LAB — COMPREHENSIVE METABOLIC PANEL
ALT: 16 U/L (ref 0–44)
AST: 18 U/L (ref 15–41)
Albumin: 3 g/dL — ABNORMAL LOW (ref 3.5–5.0)
Alkaline Phosphatase: 60 U/L (ref 38–126)
Anion gap: 13 (ref 5–15)
BUN: 11 mg/dL (ref 8–23)
CO2: 23 mmol/L (ref 22–32)
Calcium: 9.1 mg/dL (ref 8.9–10.3)
Chloride: 101 mmol/L (ref 98–111)
Creatinine, Ser: 0.66 mg/dL (ref 0.44–1.00)
GFR, Estimated: >60 mL/min (ref >60)
Glucose, Bld: 161 mg/dL — ABNORMAL HIGH (ref 70–99)
Potassium: 4.1 mmol/L (ref 3.5–5.1)
Sodium: 137 mmol/L (ref 135–145)
Total Bilirubin: 0.6 mg/dL (ref 0.3–1.2)
Total Protein: 6.7 g/dL (ref 6.5–8.1)

## 2021-02-04 LAB — IRON AND TIBC
Iron: 32 ug/dL (ref 28–170)
Saturation Ratios: 13 % (ref 10.4–31.8)
TIBC: 252 ug/dL (ref 250–450)
UIBC: 220 ug/dL

## 2021-02-04 LAB — RETICULOCYTES
Immature Retic Fract: 16.8 % — ABNORMAL HIGH (ref 2.3–15.9)
RBC.: 4.09 MIL/uL (ref 3.87–5.11)
Retic Count, Absolute: 51.9 10*3/uL (ref 19.0–186.0)
Retic Ct Pct: 1.3 % (ref 0.4–3.1)

## 2021-02-04 LAB — CBC
HCT: 31.4 % — ABNORMAL LOW (ref 36.0–46.0)
Hemoglobin: 9.7 g/dL — ABNORMAL LOW (ref 12.0–15.0)
MCH: 21.7 pg — ABNORMAL LOW (ref 26.0–34.0)
MCHC: 30.9 g/dL (ref 30.0–36.0)
MCV: 70.2 fL — ABNORMAL LOW (ref 80.0–100.0)
Platelets: 304 10*3/uL (ref 150–400)
RBC: 4.47 MIL/uL (ref 3.87–5.11)
RDW: 15.9 % — ABNORMAL HIGH (ref 11.5–15.5)
WBC: 5.9 10*3/uL (ref 4.0–10.5)
nRBC: 0 % (ref 0.0–0.2)

## 2021-02-04 LAB — FOLATE: Folate: 19 ng/mL (ref >5.9)

## 2021-02-04 LAB — SARS CORONAVIRUS 2 (TAT 6-24 HRS): SARS Coronavirus 2: NEGATIVE

## 2021-02-04 LAB — TSH: TSH: 0.408 u[IU]/mL (ref 0.350–4.500)

## 2021-02-04 LAB — HIV ANTIBODY (ROUTINE TESTING W REFLEX): HIV Screen 4th Generation wRfx: NONREACTIVE

## 2021-02-04 LAB — FERRITIN: Ferritin: 431 ng/mL — ABNORMAL HIGH (ref 11–307)

## 2021-02-04 LAB — GLUCOSE, CAPILLARY: Glucose-Capillary: 159 mg/dL — ABNORMAL HIGH (ref 70–99)

## 2021-02-04 LAB — VITAMIN B12: Vitamin B-12: 1886 pg/mL — ABNORMAL HIGH (ref 180–914)

## 2021-02-04 MED ORDER — ENOXAPARIN SODIUM 40 MG/0.4ML ~~LOC~~ SOLN
40.0000 mg | SUBCUTANEOUS | Status: DC
  Administered 2021-02-05 – 2021-02-18 (×14): 40 mg via SUBCUTANEOUS
  Filled 2021-02-04 (×14): qty 0.4

## 2021-02-04 NOTE — ED Notes (Signed)
Breakfast Ordered 

## 2021-02-04 NOTE — Progress Notes (Signed)
Patient ID: Tammy Larsen, female   DOB: 08-Jul-1951, 70 y.o.   MRN: [id number] Pt scheduled for US guided rt breast mass bx on 2/11 with local anesthesia; case has been reviewed extensively by Dr. Delaney Meigs) and d/w oncology/rad onc. Risks and benefits of procedure was discussed with the patient and/or patient's family including, but not limited to bleeding, infection, damage to adjacent structures or low yield requiring additional tests.  All of the questions were answered and there is agreement to proceed.  Consent signed and in chart.  Lovenox held tonight; may resume tomorrow night if bx performed without complications.

## 2021-02-04 NOTE — Progress Notes (Signed)
New Admission Note:  Arrival Method:pt transported by stretcher from [facility] ED Mental Orientation: Alert to person, place and situation Telemetry: yes   Assessment: Completed Skin:  Bilateral lower ext dry and cracking, edema rt lower ext. YW:SBBJ AC infusing D5 1/2 NS @100ml  Pain:no c/o pain Safety Measures: Safety Fall Prevention Plan was given, discussed. Admission: Completed 3W: Patient has been orientated to the room, unit and the staff. Family: no family at bedside but pt brother called and asked for MD to update pt  Orders have been reviewed and implemented. Will continue to monitor the patient. Call light has been placed within reach and bed alarm has been activated.   Janus Molder ,RN

## 2021-02-04 NOTE — Progress Notes (Signed)
Attempted EEG. Unable to do at this time. Pt has extremely matted hair and is unable to get to the scalp for electrode placement.

## 2021-02-04 NOTE — Consult Note (Signed)
Radiation Oncology         ([phone]) [phone] ________________________________  Initial inpatient Consultation- Conducted via telephone due to current COVID-19 concerns for limiting patient exposure  Name: Tammy Larsen MRN: [id number]  Date of Service: 02/03/2021 DOB: 1951-11-14  CC:Pcp, No  No ref. provider found   REFERRING PHYSICIAN: No ref. provider found  DIAGNOSIS: 69 y.o. female with multiple brain metastases and associated cerebral edema in addition to multiple lung metastases felt most likely secondary to a breast primary but biopsy and pathology remain pending.    ICD-10-CM   1. Partial seizure ([place])  R56.9   2. Metastatic cancer to brain [facility])  C79.31 CT CHEST ABDOMEN PELVIS W CONTRAST    CT CHEST ABDOMEN PELVIS W CONTRAST  3. Metastatic malignant neoplasm, unspecified site ([place])  C79.9   4. Malignancy ([place])  C80.1 IR Radiologist Eval & Mgmt    IR Radiologist Eval & Mgmt    HISTORY OF PRESENT ILLNESS: Tammy Larsen is a 71 y.o. female seen at the request of Altamese Dilling, NP.  She presented to the [facility] emergency department on 02/03/2021 reporting what sounds like focal seizure activity in the right upper and lower extremities, occurring during a session of PT at home.  She did not lose consciousness or bowel or bladder control and reports that the activity lasted approximately 2 minutes followed by significant generalized weakness.  On admission, she was unable to ambulate, even with the use of a walker which she normally uses to get around.  Prior to this episode, she was feeling well in general and denied any recent fever, chills, night sweats, chest pain, shortness of breath or unintended weight loss.  A CT head was performed on admission and demonstrated bilateral frontal lobe masses measuring 2.1 cm on the right and 2.6 cm in the superior medial left frontal lobe as well as a centrally located right cerebellar mass.  An MRI brain was obtained for further evaluation and showed  at least 14 metastatic brain lesions with the largest measuring 3.2 cm in the left frontal lobe, 2.2 cm in the right frontal lobe and a 2 cm lateral right parietal lobe lesion with moderate surrounding edema.  The other lesions are 1.5 cm or smaller. There was no midline shift, hydrocephalus or significant mass-effect noted.  She was started on dexamethasone and Keppra with some noted improvement in the right-sided weakness.  A CT C/A/P was obtained for further evaluation and to help identify a primary source for the brain metastases.  This exam showed scattered, irregular pulmonary nodules (at least 8) bilaterally with the largest measuring 2 cm in the right upper lobe, 2.1 cm left lower lobe and 1.4 cm left upper lobe lesion but no lymphadenopathy.  She was also noted to have a 3 cm right breast mass as well as an expansile mixed lytic and sclerotic anterior left sixth rib lesion.  There was no lymphadenopathy or other findings to suggest metastatic disease in the abdomen or pelvis.  She has not yet had tissue biopsy which will ideally be performed prior to the patient's discharge to expedite treatment and management of the brain disease, particularly in the setting of cerebral edema.  We have been consulted to discuss the possible role of radiotherapy in the management of her brain metastases.  PREVIOUS RADIATION THERAPY: No  PAST MEDICAL HISTORY: History reviewed. No pertinent past medical history.    PAST SURGICAL HISTORY:History reviewed. No pertinent surgical history.  FAMILY HISTORY: No family history  on file.  SOCIAL HISTORY:  Social History   Socioeconomic History  . Marital status: Single    Spouse name: Not on file  . Number of children: Not on file  . Years of education: Not on file  . Highest education level: Not on file  Occupational History  . Not on file  Tobacco Use  . Smoking status: Not on file  . Smokeless tobacco: Not on file  Substance and Sexual Activity  . Alcohol  use: Not on file  . Drug use: Not on file  . Sexual activity: Not on file  Other Topics Concern  . Not on file  Social History Narrative  . Not on file   Social Determinants of Health   Financial Resource Strain: Not on file  Food Insecurity: Not on file  Transportation Needs: Not on file  Physical Activity: Not on file  Stress: Not on file  Social Connections: Not on file  Intimate Partner Violence: Not on file    ALLERGIES: Penicillins  MEDICATIONS:  Current Facility-Administered Medications  Medication Dose Route Frequency Provider Last Rate Last Admin  . acetaminophen (TYLENOL) tablet 650 mg  650 mg Oral Q6H PRN Gala Romney L, MD      . dexamethasone (DECADRON) injection 4 mg  4 mg Intravenous Q6H Gala Romney L, MD   4 mg at 02/04/21 1142  . dextrose 5 %-0.45 % sodium chloride infusion   Intravenous Continuous Elwyn Reach, MD 100 mL/hr at 02/04/21 1151 New Bag at 02/04/21 1151  . enoxaparin (LOVENOX) injection 40 mg  40 mg Subcutaneous Q24H Gala Romney L, MD   40 mg at 02/04/21 0810  . levETIRAcetam (KEPPRA) tablet 500 mg  500 mg Oral BID Gala Romney L, MD   500 mg at 02/04/21 0610  . ondansetron (ZOFRAN) tablet 4 mg  4 mg Oral Q6H PRN Elwyn Reach, MD       Or  . ondansetron (ZOFRAN) injection 4 mg  4 mg Intravenous Q6H PRN Elwyn Reach, MD        REVIEW OF SYSTEMS:  On review of systems, the patient reports that she is doing well overall.  She has noticed some improvement in her generalized weakness since starting steroids and Keppra and she has not had any further focal seizure activity to her knowledge.  She denies any chest pain, shortness of breath, cough, fevers, chills, night sweats, or unintended weight changes. She denies any bowel or bladder disturbances, and denies abdominal pain, nausea or vomiting. She denies any new musculoskeletal or joint aches or pains. A complete review of systems is obtained and is otherwise negative.     PHYSICAL EXAM:  Wt Readings from Last 3 Encounters:  No data found for Wt   Temp Readings from Last 3 Encounters:  02/04/21 98.4 F (36.9 C) (Oral)   BP Readings from Last 3 Encounters:  02/04/21 116/70   Pulse Readings from Last 3 Encounters:  02/04/21 89    /Unable to assess due to telephone consult visit format.   KPS = 50  100 - Normal; no complaints; no evidence of disease. 90   - Able to carry on normal activity; minor signs or symptoms of disease. 80   - Normal activity with effort; some signs or symptoms of disease. 59   - Cares for self; unable to carry on normal activity or to do active work. 60   - Requires occasional assistance, but is able to care for most of his  personal needs. 50   - Requires considerable assistance and frequent medical care. 24   - Disabled; requires special care and assistance. 37   - Severely disabled; hospital admission is indicated although death not imminent. 32   - Very sick; hospital admission necessary; active supportive treatment necessary. 10   - Moribund; fatal processes progressing rapidly. 0     - Dead  Karnofsky DA, Abelmann [place], Craver LS and Burchenal [facility] [phone]) The use of the nitrogen mustards in the palliative treatment of carcinoma: with particular reference to bronchogenic carcinoma Cancer 1 [id number]  LABORATORY DATA:  Lab Results  Component Value Date   WBC 5.9 02/04/2021   HGB 9.7 (L) 02/04/2021   HCT 31.4 (L) 02/04/2021   MCV 70.2 (L) 02/04/2021   PLT 304 02/04/2021   Lab Results  Component Value Date   NA 137 02/04/2021   K 4.1 02/04/2021   CL 101 02/04/2021   CO2 23 02/04/2021   Lab Results  Component Value Date   ALT 16 02/04/2021   AST 18 02/04/2021   ALKPHOS 60 02/04/2021   BILITOT 0.6 02/04/2021     RADIOGRAPHY: CT Head Wo Contrast  Result Date: 02/03/2021 CLINICAL DATA:  Generalized weakness this morning. Numbness and tingling in the hands. No reported injury. EXAM: CT HEAD WITHOUT CONTRAST  TECHNIQUE: Contiguous axial images were obtained from the base of the skull through the vertex without intravenous contrast. COMPARISON:  None. FINDINGS: Brain: There are similar bilateral frontal lobe masses measuring 2.1 cm on the right (series 3/image 16) and 2.6 cm medially and superiorly on the left (series 3/image 23) with central low-attenuation and soft tissue rim with surrounding prominent bilateral frontal and right temporal white matter hypoattenuation. There is associated bilateral frontal and right temporal sulcal effacement. Scattered small regions of loss of gray-white matter differentiation are noted in bilateral frontal and right temporal lobes. Low-attenuation focus centrally in the right cerebellar hemisphere without significant mass effect. No evidence of parenchymal hemorrhage or extra-axial fluid collection. No midline shift. Cerebral volume is age appropriate. No ventriculomegaly. Vascular: No acute abnormality. Skull: No evidence of calvarial fracture. Sinuses/Orbits: The visualized paranasal sinuses are essentially clear. Other:  The mastoid air cells are unopacified. IMPRESSION: 1. Bilateral frontal lobe masses, with surrounding prominent bilateral frontal and right temporal white matter greater than cortical hypoattenuation and associated sulcal effacement. Low-attenuation central right cerebellar hemisphere focus. Findings are suspicious for metastatic disease. MRI brain without and with IV contrast is recommended for further evaluation. 2. No evidence of acute intracranial hemorrhage. Critical Value/emergent results were called by telephone at the time of interpretation on 02/03/2021 at 1:18 pm to provider [facility] , who verbally acknowledged these results. Electronically Signed   By: Ilona Sorrel M.D.   On: 02/03/2021 13:22   MR Brain W and Wo Contrast  Result Date: 02/03/2021 CLINICAL DATA:  Metastatic disease follow-up EXAM: MRI HEAD WITHOUT AND WITH CONTRAST TECHNIQUE:  Multiplanar, multiecho pulse sequences of the brain and surrounding structures were obtained without and with intravenous contrast. CONTRAST:  33mL GADAVIST GADOBUTROL 1 MMOL/ML IV SOLN COMPARISON:  None. FINDINGS: Brain: There are multiple intracranial metastatic lesions within both hemispheres and in the right cerebellar hemisphere. There is a small amount of associated with the right cerebellar lesion only. No intrinsic hyperintense T1-weighted signal. There is moderate edema surrounding most of the lesions, worst surrounding the lesions that are numbered 11, 9, 4 and 5 below. Lesion sizes and locations: 1. Right cerebellum, 1.4 cm, series  11, image 9 2. Medial right temporal lobe 1.1 cm image 17 3. Inferior left frontal lobe 1.3 cm image 20 4. Right frontal operculum 1.4 cm image 24 5. Posterior right frontal operculum 1.5 cm image 24 6. Left occipital lobe 1.4 cm image 25 7. Left frontal lobe 0.6 cm image 23 8. Lateral left parietal lobe 1.4 cm image 28 9. Right frontal white matter 2.2 cm image 27 10. Right frontal periventricular white matter 1.1 cm image 28 11. Paramedian left frontal lobe 3.2 cm image 35 12. Right parietal lobe 0.8 cm image 33 13. Lateral right parietal lobe 2.0 cm image 37 14. Posteroinferior left lentiform nucleus 0.3 cm image 18 There is no midline shift, hydrocephalus or significant mass effect. Vascular: Normal flow voids. Skull and upper cervical spine: Normal marrow signal. Sinuses/Orbits: Negative. Other: None. IMPRESSION: Multiple intracranial metastatic lesions, the largest of which measures up to 3.2 cm. Moderate multifocal edema without midline shift or significant mass effect. Electronically Signed   By: Ulyses Jarred M.D.   On: 02/03/2021 19:35   CT CHEST ABDOMEN PELVIS W CONTRAST  Result Date: 02/03/2021 CLINICAL DATA:  Seizure. Abnormal head CT suspicious for brain metastases. EXAM: CT CHEST, ABDOMEN, AND PELVIS WITH CONTRAST TECHNIQUE: Multidetector CT imaging of the  chest, abdomen and pelvis was performed following the standard protocol during bolus administration of intravenous contrast. CONTRAST:  11mL OMNIPAQUE IOHEXOL 300 MG/ML  SOLN COMPARISON:  None. FINDINGS: CT CHEST FINDINGS Cardiovascular: Normal heart size. No significant pericardial effusion/thickening. Atherosclerotic nonaneurysmal thoracic aorta. Top-normal caliber main pulmonary artery (3.2 cm diameter). No central pulmonary emboli. Mediastinum/Nodes: No discrete thyroid nodules. Unremarkable esophagus. No pathologically enlarged axillary, mediastinal or hilar lymph nodes. Lungs/Pleura: No pneumothorax. No pleural effusion. No acute consolidative airspace disease. Numerous (at least 8) solid irregular pulmonary nodules scattered throughout the lungs bilaterally, largest 2.0 cm in the right upper lobe (series 5/image 42), 2.1 cm in the left lower lobe (series 5/image 78) and 1.4 cm in the left upper lobe (series 5/image 42). Musculoskeletal: Moderate thoracic spondylosis. Mild anterior T8 vertebral compression fracture of uncertain chronicity, chronic appearing. Healing subacute posterior right eleventh rib fracture without associated discrete osseous lesion. Mildly expansile mixed lytic and sclerotic anterior left sixth rib lesion (series 5/image 100). Inner right breast irregular 3.0 x 1.9 cm soft tissue mass (series 3/image 36). CT ABDOMEN PELVIS FINDINGS Hepatobiliary: Normal liver with no liver mass. Normal gallbladder with no radiopaque cholelithiasis. No biliary ductal dilatation. Pancreas: Normal, with no mass or duct dilation. Spleen: Normal size. No mass. Adrenals/Urinary Tract: Normal adrenals. Normal kidneys with no hydronephrosis and no renal mass. Normal bladder. Stomach/Bowel: Normal non-distended stomach. Normal caliber small bowel with no small bowel wall thickening. Normal candidate appendix. Questionable focal annular wall thickening versus under distention in the ascending colon (series 3/image  73). Otherwise normal large bowel. Vascular/Lymphatic: Atherosclerotic nonaneurysmal abdominal aorta. Patent portal, splenic, hepatic and renal veins. No pathologically enlarged lymph nodes in the abdomen or pelvis. Reproductive: Enlarged uterus with coarse scattered internal calcifications and multiple uterine masses, largest 5.6 cm anteriorly, presumably uterine fibroids. No adnexal masses. Other: No pneumoperitoneum, ascites or focal fluid collection. Musculoskeletal: No aggressive appearing focal osseous lesions. Marked lumbar spondylosis. IMPRESSION: 1. Numerous (at least 8) solid irregular pulmonary nodules scattered throughout the lungs bilaterally, largest 2.0 cm in the right upper lobe, compatible with pulmonary metastases. 2. Inner right breast irregular 3.0 x 1.9 cm soft tissue mass, cannot exclude primary breast malignancy. Diagnostic mammographic evaluation recommended at a  women's imaging center. 3. Small mildly expansile mixed lytic and sclerotic anterior left sixth rib lesion, cannot exclude osseous metastasis. Healing subacute posterior right eleventh rib fracture without associated discrete osseous lesion. Mild anterior T8 vertebral compression fracture of uncertain chronicity, chronic appearing. 4. No evidence of metastatic disease in the abdomen or pelvis. 5. Questionable focal annular wall thickening versus underdistention in the ascending colon. Correlation with the patient's colon cancer screening history is recommended. If screening is not up-to-date, appropriate screening should be considered. 6. Enlarged uterus with multiple uterine masses, presumably uterine fibroids. 7. Aortic Atherosclerosis (ICD10-I70.0). Electronically Signed   By: Ilona Sorrel M.D.   On: 02/03/2021 17:46      IMPRESSION/PLAN:  This visit was conducted via telephone to spare the patient unnecessary potential exposure in the healthcare setting during the current COVID-19 pandemic.  10. 69 y.o. female with multiple  brain metastases and associated cerebral edema in addition to multiple lung metastases felt most likely secondary to a breast primary but biopsy and pathology remain pending.  Today, we talked to the patient and her brother, Tammy Larsen, about the findings and workup thus far. We discussed the natural history of metastatic carcinoma and general treatment, highlighting the role of radiotherapy in the management brain metastases. We discussed the available radiation techniques, and focused on the details and logistics of delivery.  Given the numerous brain lesions, it is felt that she will benefit from whole brain radiotherapy delivered over the course of 10 daily treatments.  We discussed that whole brain radiotherapy would treat the known metastatic deposits and help provide some reduction of risk for future brain metastases. We reviewed the anticipated acute and late sequelae associated with radiation in this setting, including but not limited to: hair loss, subacute somnolence, and neurocognitive changes including a possible reduction in short-term memory. Whole brain radiotherapy also may carry a lower likelihood of tumor control at the treatment sites because of the low-dose used. The patient was encouraged to ask questions that were answered to her satisfaction.  At the conclusion of our discussion, the patient is in agreement to proceed with tissue biopsy as inpatient, to expedite confirmation of primary source, prior to starting whole brain radiation.  We will tentatively plan for CT SIM/treatment planning at 3pm on Friday, 02/05/21 in anticipation of beginning the daily treatments once we have pathology. If the patient is felt to be stable for transfer over to [facility], this would certainly help facilitate radiation treatments with more ease but we will leave this to the discretion of her hospital medicine team. Also, if patient is deemed stable for discharge home after tissue biopsy and prior to or during  the course of radiation, we can arrange for the radiation to occur on an outpatient basis. Her brother, Tammy Larsen, expressed concern regarding transportation to and from treatments once discharged given her high level of assistance required. He was reassured that the social worker assigned to her case will evaluate needs and discuss potential options regarding skilled nursing versus rehab facility placement prior to her discharge. They were very relieved and appreciative of this information.  Given current concerns for patient exposure during the COVID-19 pandemic, this encounter was conducted via telephone. The patient has given verbal consent for this type of encounter. The time spent during this encounter was 70 minutes. The attendants for this meeting include Tyler Pita MD, Jimi Schappert PA-C, and patient, Tammy Larsen and her brother, Tammy Larsen. During the encounter, Tyler Pita MD and [facility] PA-C, were  located at [facility] Radiation Oncology Department.  Patient, Tammy Larsen and her brother, Tammy Larsen, were located at [facility] 3W02.  In a visit lasting 70 minutes, greater than 50% of that time was spent in floor time, discussing his/her case and coordinating his/her care.   Nicholos Johns, PA-C    Tyler Pita, MD  [facility] Direct Dial: [phone]  Fax: [phone] [email or web address]  Skype  LinkedIn

## 2021-02-04 NOTE — Progress Notes (Signed)
PROGRESS NOTE    Tammy Larsen  ZOX:[id number] DOB: 1951/01/11 DOA: 02/03/2021 PCP: Pcp, No   Chief Complaint  Patient presents with  . Seizures  . Weakness    Brief Narrative:   70 y.o. female with medical history significant of osteoarthritis otherwise no significant past medical history.  Patient does home physical therapy and while doing session today for her right knee with the physical therapist she had a seizure-like activity of her right arm and leg. No loss of consciousness, no loss of bowel and bladder control. She was brought in to ED,  . CT abdomen and pelvis shows multiple lung metastasis, also 3 x 1.9 cm soft tissue mass in the right breast.  Small lytic and sclerotic lesion in the left sixth rib.  No evidence of metastatic disease in the abdomen or pelvis and evidence of uterine fibroids and possible: Lesion.  Head CT without contrast showed bilateral frontal lobe masses with surrounding prominent bilateral frontal and right temporal white matter greater than cortical hypoattenuation.  No intracranial hemorrhage.  MRI of the brain showed multiple intracranial metastatic lesions largest about 3.2 cm.  There is moderate multifocal edema but no midline shift or significant mass-effect.  Patient has been initiated on Keppra and is being admitted to the hospital for evaluation and treatment.  IR consulted for breast mass biopsy, referred to outpatient breast center for biopsy and follow up .  Oncology and radiation oncology consulted for recommendations.    Assessment & Plan:   Principal Problem:   Brain metastases ([place]) Active Problems:   Seizure ([place])   Osteoarthritis   Seizures probably secondary to edema and brain mets Empirically on anti seizure meds and decadron.  Neurology consulted by admitting physician. Seizure precautions.     Unknown primary with mets to brain, breast and lung:  Will need diag mammogramand biopsy to be scheduled as outpatient.     Anemia  probably sec to malignancy.  Will get anemia panel.      DVT prophylaxis: (SCD'S) Code Status: (Full CODE) Family Communication: (none at bedside) Disposition:   Status is: Inpatient  Remains inpatient appropriate because:Ongoing diagnostic testing needed not appropriate for outpatient work up and Unsafe d/c plan   Dispo: The patient is from: Home              Anticipated d/c is to: pending.              Anticipated d/c date is: 2 days              Patient currently is not medically stable to d/c.   Difficult to place patient No       Consultants:   neurology  Procedures: none.   Antimicrobials:none.    Subjective: Reports that she didn't take care of herself properly.   Objective: Vitals:   02/04/21 0607 02/04/21 0912 02/04/21 0945 02/04/21 1209  BP: 126/73 125/75 129/68 116/70  Pulse: 74 69 72 89  Resp: 18 18 20 20   Temp:  98.5 F (36.9 C) (!) 97.5 F (36.4 C) 98.4 F (36.9 C)  TempSrc:  Oral Oral Oral  SpO2: 99% 99% 100% 100%    Intake/Output Summary (Last 24 hours) at 02/04/2021 1341 Last data filed at 02/03/2021 2201 Gross per 24 hour  Intake 200 ml  Output -  Net 200 ml   There were no vitals filed for this visit.  Examination:  General exam: Appears calm and comfortable  Respiratory system: Clear to auscultation. Respiratory effort  normal. Cardiovascular system: S1 & S2 heard, RRR. No JVD,No pedal edema. Gastrointestinal system: Abdomen is nondistended, soft and nontender.  Normal bowel sounds heard. Central nervous system: Alert and oriented, grossly non focal.  Extremities: Symmetric 5 x 5 power. Skin: No rashes, lesions or ulcers Psychiatry: Mood & affect appropriate.     Data Reviewed: I have personally reviewed following labs and imaging studies  CBC: Recent Labs  Lab 02/03/21 1204 02/03/21 2212 02/04/21 0222  WBC 8.4 8.1 5.9  HGB 9.4* 10.0* 9.7*  HCT 29.6* 32.6* 31.4*  MCV 69.6* 70.7* 70.2*  PLT 289 324 304    Basic  Metabolic Panel: Recent Labs  Lab 02/03/21 1204 02/03/21 2212 02/04/21 0222  NA 140  --  137  K 4.1  --  4.1  CL 101  --  101  CO2 26  --  23  GLUCOSE 105*  --  161*  BUN 13  --  11  CREATININE 0.76 0.69 0.66  CALCIUM 9.5  --  9.1    GFR: CrCl cannot be calculated (Unknown ideal weight.).  Liver Function Tests: Recent Labs  Lab 02/04/21 0222  AST 18  ALT 16  ALKPHOS 60  BILITOT 0.6  PROT 6.7  ALBUMIN 3.0*    CBG: No results for input(s): GLUCAP in the last 168 hours.   Recent Results (from the past 240 hour(s))  SARS CORONAVIRUS 2 (TAT 6-24 HRS) Nasopharyngeal Nasopharyngeal Swab     Status: None   Collection Time: 02/03/21 10:12 PM   Specimen: Nasopharyngeal Swab  Result Value Ref Range Status   SARS Coronavirus 2 NEGATIVE NEGATIVE Final    Comment: (NOTE) SARS-CoV-2 target nucleic acids are NOT DETECTED.  The SARS-CoV-2 RNA is generally detectable in upper and lower respiratory specimens during the acute phase of infection. Negative results do not preclude SARS-CoV-2 infection, do not rule out co-infections with other pathogens, and should not be used as the sole basis for treatment or other patient management decisions. Negative results must be combined with clinical observations, patient history, and epidemiological information. The expected result is Negative.  Fact Sheet for Patients: SugarRoll.be  Fact Sheet for Healthcare Providers: https://www.woods-mathews.com/  This test is not yet approved or cleared by the [place] FDA and  has been authorized for detection and/or diagnosis of SARS-CoV-2 by FDA under an Emergency Use Authorization (EUA). This EUA will remain  in effect (meaning this test can be used) for the duration of the COVID-19 declaration under Se ction 564(b)(1) of the Act, 21 U.S.C. section 360bbb-3(b)(1), unless the authorization is terminated or revoked sooner.  Performed at [facility], [place] [address], [place], [place] [address]          Radiology Studies: CT Head Wo Contrast  Result Date: 02/03/2021 CLINICAL DATA:  Generalized weakness this morning. Numbness and tingling in the hands. No reported injury. EXAM: CT HEAD WITHOUT CONTRAST TECHNIQUE: Contiguous axial images were obtained from the base of the skull through the vertex without intravenous contrast. COMPARISON:  None. FINDINGS: Brain: There are similar bilateral frontal lobe masses measuring 2.1 cm on the right (series 3/image 16) and 2.6 cm medially and superiorly on the left (series 3/image 23) with central low-attenuation and soft tissue rim with surrounding prominent bilateral frontal and right temporal white matter hypoattenuation. There is associated bilateral frontal and right temporal sulcal effacement. Scattered small regions of loss of gray-white matter differentiation are noted in bilateral frontal and right temporal lobes. Low-attenuation focus centrally in  the right cerebellar hemisphere without significant mass effect. No evidence of parenchymal hemorrhage or extra-axial fluid collection. No midline shift. Cerebral volume is age appropriate. No ventriculomegaly. Vascular: No acute abnormality. Skull: No evidence of calvarial fracture. Sinuses/Orbits: The visualized paranasal sinuses are essentially clear. Other:  The mastoid air cells are unopacified. IMPRESSION: 1. Bilateral frontal lobe masses, with surrounding prominent bilateral frontal and right temporal white matter greater than cortical hypoattenuation and associated sulcal effacement. Low-attenuation central right cerebellar hemisphere focus. Findings are suspicious for metastatic disease. MRI brain without and with IV contrast is recommended for further evaluation. 2. No evidence of acute intracranial hemorrhage. Critical Value/emergent results were called by telephone at the time of interpretation on 02/03/2021 at 1:18 pm to provider [facility] , who verbally acknowledged these results. Electronically Signed   By: Ilona Sorrel M.D.   On: 02/03/2021 13:22   MR Brain W and Wo Contrast  Result Date: 02/03/2021 CLINICAL DATA:  Metastatic disease follow-up EXAM: MRI HEAD WITHOUT AND WITH CONTRAST TECHNIQUE: Multiplanar, multiecho pulse sequences of the brain and surrounding structures were obtained without and with intravenous contrast. CONTRAST:  12mL GADAVIST GADOBUTROL 1 MMOL/ML IV SOLN COMPARISON:  None. FINDINGS: Brain: There are multiple intracranial metastatic lesions within both hemispheres and in the right cerebellar hemisphere. There is a small amount of associated with the right cerebellar lesion only. No intrinsic hyperintense T1-weighted signal. There is moderate edema surrounding most of the lesions, worst surrounding the lesions that are numbered 11, 9, 4 and 5 below. Lesion sizes and locations: 1. Right cerebellum, 1.4 cm, series 11, image 9 2. Medial right temporal lobe 1.1 cm image 17 3. Inferior left frontal lobe 1.3 cm image 20 4. Right frontal operculum 1.4 cm image 24 5. Posterior right frontal operculum 1.5 cm image 24 6. Left occipital lobe 1.4 cm image 25 7. Left frontal lobe 0.6 cm image 23 8. Lateral left parietal lobe 1.4 cm image 28 9. Right frontal white matter 2.2 cm image 27 10. Right frontal periventricular white matter 1.1 cm image 28 11. Paramedian left frontal lobe 3.2 cm image 35 12. Right parietal lobe 0.8 cm image 33 13. Lateral right parietal lobe 2.0 cm image 37 14. Posteroinferior left lentiform nucleus 0.3 cm image 18 There is no midline shift, hydrocephalus or significant mass effect. Vascular: Normal flow voids. Skull and upper cervical spine: Normal marrow signal. Sinuses/Orbits: Negative. Other: None. IMPRESSION: Multiple intracranial metastatic lesions, the largest of which measures up to 3.2 cm. Moderate multifocal edema without midline shift or significant mass effect. Electronically Signed   By: Ulyses Jarred M.D.   On: 02/03/2021 19:35   CT CHEST ABDOMEN PELVIS W CONTRAST  Result Date: 02/03/2021 CLINICAL DATA:  Seizure. Abnormal head CT suspicious for brain metastases. EXAM: CT CHEST, ABDOMEN, AND PELVIS WITH CONTRAST TECHNIQUE: Multidetector CT imaging of the chest, abdomen and pelvis was performed following the standard protocol during bolus administration of intravenous contrast. CONTRAST:  63mL OMNIPAQUE IOHEXOL 300 MG/ML  SOLN COMPARISON:  None. FINDINGS: CT CHEST FINDINGS Cardiovascular: Normal heart size. No significant pericardial effusion/thickening. Atherosclerotic nonaneurysmal thoracic aorta. Top-normal caliber main pulmonary artery (3.2 cm diameter). No central pulmonary emboli. Mediastinum/Nodes: No discrete thyroid nodules. Unremarkable esophagus. No pathologically enlarged axillary, mediastinal or hilar lymph nodes. Lungs/Pleura: No pneumothorax. No pleural effusion. No acute consolidative airspace disease. Numerous (at least 8) solid irregular pulmonary nodules scattered throughout the lungs bilaterally, largest 2.0 cm in the right upper lobe (series 5/image 42), 2.1 cm  in the left lower lobe (series 5/image 78) and 1.4 cm in the left upper lobe (series 5/image 42). Musculoskeletal: Moderate thoracic spondylosis. Mild anterior T8 vertebral compression fracture of uncertain chronicity, chronic appearing. Healing subacute posterior right eleventh rib fracture without associated discrete osseous lesion. Mildly expansile mixed lytic and sclerotic anterior left sixth rib lesion (series 5/image 100). Inner right breast irregular 3.0 x 1.9 cm soft tissue mass (series 3/image 36). CT ABDOMEN PELVIS FINDINGS Hepatobiliary: Normal liver with no liver mass. Normal gallbladder with no radiopaque cholelithiasis. No biliary ductal dilatation. Pancreas: Normal, with no mass or duct dilation. Spleen: Normal size. No mass. Adrenals/Urinary Tract: Normal adrenals. Normal kidneys with no hydronephrosis and no  renal mass. Normal bladder. Stomach/Bowel: Normal non-distended stomach. Normal caliber small bowel with no small bowel wall thickening. Normal candidate appendix. Questionable focal annular wall thickening versus under distention in the ascending colon (series 3/image 73). Otherwise normal large bowel. Vascular/Lymphatic: Atherosclerotic nonaneurysmal abdominal aorta. Patent portal, splenic, hepatic and renal veins. No pathologically enlarged lymph nodes in the abdomen or pelvis. Reproductive: Enlarged uterus with coarse scattered internal calcifications and multiple uterine masses, largest 5.6 cm anteriorly, presumably uterine fibroids. No adnexal masses. Other: No pneumoperitoneum, ascites or focal fluid collection. Musculoskeletal: No aggressive appearing focal osseous lesions. Marked lumbar spondylosis. IMPRESSION: 1. Numerous (at least 8) solid irregular pulmonary nodules scattered throughout the lungs bilaterally, largest 2.0 cm in the right upper lobe, compatible with pulmonary metastases. 2. Inner right breast irregular 3.0 x 1.9 cm soft tissue mass, cannot exclude primary breast malignancy. Diagnostic mammographic evaluation recommended at a women's imaging center. 3. Small mildly expansile mixed lytic and sclerotic anterior left sixth rib lesion, cannot exclude osseous metastasis. Healing subacute posterior right eleventh rib fracture without associated discrete osseous lesion. Mild anterior T8 vertebral compression fracture of uncertain chronicity, chronic appearing. 4. No evidence of metastatic disease in the abdomen or pelvis. 5. Questionable focal annular wall thickening versus underdistention in the ascending colon. Correlation with the patient's colon cancer screening history is recommended. If screening is not up-to-date, appropriate screening should be considered. 6. Enlarged uterus with multiple uterine masses, presumably uterine fibroids. 7. Aortic Atherosclerosis (ICD10-I70.0). Electronically  Signed   By: Ilona Sorrel M.D.   On: 02/03/2021 17:46        Scheduled Meds: . dexamethasone (DECADRON) injection  4 mg Intravenous Q6H  . enoxaparin (LOVENOX) injection  40 mg Subcutaneous Q24H  . levETIRAcetam  500 mg Oral BID   Continuous Infusions: . dextrose 5 % and 0.45% NaCl 100 mL/hr at 02/04/21 1151     LOS: 1 day       Hosie Poisson, MD [facility]   To contact the attending provider between 7A-7P or the covering provider during after hours 7P-7A, please log into the web site [email or web address] and access using universal [place] password for that web site. If you do not have the password, please call the hospital operator.  02/04/2021, 1:41 PM

## 2021-02-04 NOTE — Progress Notes (Signed)
Phoned [facility]. Spoke with Joelene Millin. Made arrangements for patient to travel via [place] tomorrow to radiation oncology by 3 pm for a CT simulation in preparation to begin radiation therapy next week. Informed Aaron Edelman in [facility] that transportation has been arranged.

## 2021-02-04 NOTE — Consult Note (Signed)
[place]  [phone] [phone]   MEDICAL ONCOLOGY - INITIAL CONSULTATION  Referral MD: Dr. Karleen Hampshire  Reason for Referral: Brain lesions, right breast mass, lung lesions, and bone lesions  HPI: [name] is a 70 year old female with a past medical history significant only for osteoarthritis. The patient was brought to the emergency room following seizure-like activity in her right arm and leg and weakness. CT of the head without contrast was initially performed which showed bilateral frontal lobe masses and a low-attenuation central right cerebellar hemisphere focus. MRI of the brain with and without contrast was performed which showed multiple intracranial metastatic lesions the largest of which measures 3.2 cm with moderate multifocal edema without midline shift or significant mass-effect. A CT of the chest/abdomen/pelvis was performed which showed numerous irregular pulmonary nodules scattered throughout the lungs bilaterally, right breast mass measuring 3.0 x 1.9 cm, small mildly expansile mixed lytic and sclerotic anterior left sixth rib lesion, questionable focal annular wall thickening versus underdistention of the ascending colon, enlarged uterus with multiple uterine masses which are presumed to be fibroids. The patient has been started on dexamethasone and Keppra.  The patient was seen in her hospital room today. No family at the bedside. She reports that she started to have seizure-like activity in her right arm and right leg yesterday while working with physical therapy at the home. She has been working with physical therapy for right knee pain. She has not had any history of seizures. No recurrent seizure activity since receiving dexamethasone and Keppra. The patient tells me that she has been able to feel a mass in her right breast for at least 2 years. This mass has not changed in size and is not causing any pain or pruritus. Last mammogram was in 2003.  She reports that she has dizziness which has been present for at least 65 years. She states that she has vertigo. She is not having any fevers, chills, headaches. Denies chest pain, shortness of breath, cough, abdominal pain, nausea, vomiting, constipation, diarrhea. No bleeding reported. The patient is single. She currently lives with her younger brother. No children. Denies history of alcohol and tobacco use. Medical oncology was asked see the patient for recommendations regarding her abnormal imaging findings.   Past Medical History:  Diagnosis Date  . Arthritis   :  History reviewed. No pertinent surgical history.:  Current Facility-Administered Medications  Medication Dose Route Frequency Provider Last Rate Last Admin  . acetaminophen (TYLENOL) tablet 650 mg  650 mg Oral Q6H PRN Gala Romney L, MD      . dexamethasone (DECADRON) injection 4 mg  4 mg Intravenous Q6H Gala Romney L, MD   4 mg at 02/04/21 1142  . dextrose 5 %-0.45 % sodium chloride infusion   Intravenous Continuous Elwyn Reach, MD 100 mL/hr at 02/04/21 1151 New Bag at 02/04/21 1151  . enoxaparin (LOVENOX) injection 40 mg  40 mg Subcutaneous Q24H Gala Romney L, MD   40 mg at 02/04/21 0810  . levETIRAcetam (KEPPRA) tablet 500 mg  500 mg Oral BID Gala Romney L, MD   500 mg at 02/04/21 0610  . ondansetron (ZOFRAN) tablet 4 mg  4 mg Oral Q6H PRN Elwyn Reach, MD       Or  . ondansetron (ZOFRAN) injection 4 mg  4 mg Intravenous Q6H PRN Elwyn Reach, MD         Allergies  Allergen Reactions  . Penicillins Itching  :  Family  History  Problem Relation Age of Onset  . Cancer Neg Hx   :  Social History   Socioeconomic History  . Marital status: Single    Spouse name: Not on file  . Number of children: 0  . Years of education: Not on file  . Highest education level: Not on file  Occupational History  . Not on file  Tobacco Use  . Smoking status: Never Smoker  . Smokeless tobacco: Never  Used  Substance and Sexual Activity  . Alcohol use: Never  . Drug use: Not on file  . Sexual activity: Not on file  Other Topics Concern  . Not on file  Social History Narrative  . Not on file   Social Determinants of Health   Financial Resource Strain: Not on file  Food Insecurity: Not on file  Transportation Needs: Not on file  Physical Activity: Not on file  Stress: Not on file  Social Connections: Not on file  Intimate Partner Violence: Not on file  :  Review of Systems: A comprehensive 14 point review of systems was negative except as noted in the HPI.  Exam: Patient Vitals for the past 24 hrs:  BP Temp Temp src Pulse Resp SpO2  02/04/21 1209 116/70 98.4 F (36.9 C) Oral 89 20 100 %  02/04/21 0945 129/68 (!) 97.5 F (36.4 C) Oral 72 20 100 %  02/04/21 0912 125/75 98.5 F (36.9 C) Oral 69 18 99 %  02/04/21 0607 126/73 -- -- 74 18 99 %  02/04/21 0130 121/71 -- -- 83 19 97 %  02/04/21 0115 124/77 -- -- 82 19 98 %  02/04/21 0100 124/72 -- -- 84 18 97 %  02/04/21 0045 122/68 -- -- 80 18 97 %  02/04/21 0030 (!) 142/69 -- -- 80 17 96 %  02/04/21 0015 (!) 157/75 -- -- 94 19 94 %  02/04/21 0000 (!) 145/75 -- -- 85 20 99 %  02/03/21 2345 138/76 -- -- 78 19 98 %  02/03/21 2300 (!) 146/80 -- -- (!) 101 17 99 %  02/03/21 2230 -- -- -- 97 14 99 %  02/03/21 2215 (!) 160/90 -- -- -- -- --  02/03/21 1645 (!) 128/94 -- -- 96 (!) 21 100 %    General: Awake and alert, no distress Eyes: EOMI, PERRL, no scleral icterus.   ENT:  There were no oropharyngeal lesions.   Lymphatics:  Negative cervical, supraclavicular or axillary adenopathy.   Breasts: No palpable masses or axillary lymphadenopathy in the left breast. Right breast with a 3 cm mass in the anterior aspect of the right breast. No axillary lymphadenopathy on the right. Respiratory: lungs were clear bilaterally without wheezing or crackles.   Cardiovascular:  Regular rate and rhythm, S1/S2, without murmur, rub or gallop.   There was no pedal edema.   GI:  abdomen was soft, flat, nontender, nondistended, without organomegaly.   Musculoskeletal:  no spinal tenderness of palpation of vertebral spine.   Skin exam was without echymosis, petichae.   Neuro exam was nonfocal. Patient was alert and oriented.  Attention was good. Language was appropriate.  Mood was normal without depression.  Speech was not pressured.  Thought content was not tangential.     Lab Results  Component Value Date   WBC 5.9 02/04/2021   HGB 9.7 (L) 02/04/2021   HCT 31.4 (L) 02/04/2021   PLT 304 02/04/2021   GLUCOSE 161 (H) 02/04/2021   ALT 16 02/04/2021   AST 18  02/04/2021   NA 137 02/04/2021   K 4.1 02/04/2021   CL 101 02/04/2021   CREATININE 0.66 02/04/2021   BUN 11 02/04/2021   CO2 23 02/04/2021    CT Head Wo Contrast  Result Date: 02/03/2021 CLINICAL DATA:  Generalized weakness this morning. Numbness and tingling in the hands. No reported injury. EXAM: CT HEAD WITHOUT CONTRAST TECHNIQUE: Contiguous axial images were obtained from the base of the skull through the vertex without intravenous contrast. COMPARISON:  None. FINDINGS: Brain: There are similar bilateral frontal lobe masses measuring 2.1 cm on the right (series 3/image 16) and 2.6 cm medially and superiorly on the left (series 3/image 23) with central low-attenuation and soft tissue rim with surrounding prominent bilateral frontal and right temporal white matter hypoattenuation. There is associated bilateral frontal and right temporal sulcal effacement. Scattered small regions of loss of gray-white matter differentiation are noted in bilateral frontal and right temporal lobes. Low-attenuation focus centrally in the right cerebellar hemisphere without significant mass effect. No evidence of parenchymal hemorrhage or extra-axial fluid collection. No midline shift. Cerebral volume is age appropriate. No ventriculomegaly. Vascular: No acute abnormality. Skull: No evidence of calvarial  fracture. Sinuses/Orbits: The visualized paranasal sinuses are essentially clear. Other:  The mastoid air cells are unopacified. IMPRESSION: 1. Bilateral frontal lobe masses, with surrounding prominent bilateral frontal and right temporal white matter greater than cortical hypoattenuation and associated sulcal effacement. Low-attenuation central right cerebellar hemisphere focus. Findings are suspicious for metastatic disease. MRI brain without and with IV contrast is recommended for further evaluation. 2. No evidence of acute intracranial hemorrhage. Critical Value/emergent results were called by telephone at the time of interpretation on 02/03/2021 at 1:18 pm to provider [facility] , who verbally acknowledged these results. Electronically Signed   By: Ilona Sorrel M.D.   On: 02/03/2021 13:22   MR Brain W and Wo Contrast  Result Date: 02/03/2021 CLINICAL DATA:  Metastatic disease follow-up EXAM: MRI HEAD WITHOUT AND WITH CONTRAST TECHNIQUE: Multiplanar, multiecho pulse sequences of the brain and surrounding structures were obtained without and with intravenous contrast. CONTRAST:  54mL GADAVIST GADOBUTROL 1 MMOL/ML IV SOLN COMPARISON:  None. FINDINGS: Brain: There are multiple intracranial metastatic lesions within both hemispheres and in the right cerebellar hemisphere. There is a small amount of associated with the right cerebellar lesion only. No intrinsic hyperintense T1-weighted signal. There is moderate edema surrounding most of the lesions, worst surrounding the lesions that are numbered 11, 9, 4 and 5 below. Lesion sizes and locations: 1. Right cerebellum, 1.4 cm, series 11, image 9 2. Medial right temporal lobe 1.1 cm image 17 3. Inferior left frontal lobe 1.3 cm image 20 4. Right frontal operculum 1.4 cm image 24 5. Posterior right frontal operculum 1.5 cm image 24 6. Left occipital lobe 1.4 cm image 25 7. Left frontal lobe 0.6 cm image 23 8. Lateral left parietal lobe 1.4 cm image 28 9. Right frontal  white matter 2.2 cm image 27 10. Right frontal periventricular white matter 1.1 cm image 28 11. Paramedian left frontal lobe 3.2 cm image 35 12. Right parietal lobe 0.8 cm image 33 13. Lateral right parietal lobe 2.0 cm image 37 14. Posteroinferior left lentiform nucleus 0.3 cm image 18 There is no midline shift, hydrocephalus or significant mass effect. Vascular: Normal flow voids. Skull and upper cervical spine: Normal marrow signal. Sinuses/Orbits: Negative. Other: None. IMPRESSION: Multiple intracranial metastatic lesions, the largest of which measures up to 3.2 cm. Moderate multifocal edema without midline shift  or significant mass effect. Electronically Signed   By: Ulyses Jarred M.D.   On: 02/03/2021 19:35   CT CHEST ABDOMEN PELVIS W CONTRAST  Result Date: 02/03/2021 CLINICAL DATA:  Seizure. Abnormal head CT suspicious for brain metastases. EXAM: CT CHEST, ABDOMEN, AND PELVIS WITH CONTRAST TECHNIQUE: Multidetector CT imaging of the chest, abdomen and pelvis was performed following the standard protocol during bolus administration of intravenous contrast. CONTRAST:  39mL OMNIPAQUE IOHEXOL 300 MG/ML  SOLN COMPARISON:  None. FINDINGS: CT CHEST FINDINGS Cardiovascular: Normal heart size. No significant pericardial effusion/thickening. Atherosclerotic nonaneurysmal thoracic aorta. Top-normal caliber main pulmonary artery (3.2 cm diameter). No central pulmonary emboli. Mediastinum/Nodes: No discrete thyroid nodules. Unremarkable esophagus. No pathologically enlarged axillary, mediastinal or hilar lymph nodes. Lungs/Pleura: No pneumothorax. No pleural effusion. No acute consolidative airspace disease. Numerous (at least 8) solid irregular pulmonary nodules scattered throughout the lungs bilaterally, largest 2.0 cm in the right upper lobe (series 5/image 42), 2.1 cm in the left lower lobe (series 5/image 78) and 1.4 cm in the left upper lobe (series 5/image 42). Musculoskeletal: Moderate thoracic spondylosis. Mild  anterior T8 vertebral compression fracture of uncertain chronicity, chronic appearing. Healing subacute posterior right eleventh rib fracture without associated discrete osseous lesion. Mildly expansile mixed lytic and sclerotic anterior left sixth rib lesion (series 5/image 100). Inner right breast irregular 3.0 x 1.9 cm soft tissue mass (series 3/image 36). CT ABDOMEN PELVIS FINDINGS Hepatobiliary: Normal liver with no liver mass. Normal gallbladder with no radiopaque cholelithiasis. No biliary ductal dilatation. Pancreas: Normal, with no mass or duct dilation. Spleen: Normal size. No mass. Adrenals/Urinary Tract: Normal adrenals. Normal kidneys with no hydronephrosis and no renal mass. Normal bladder. Stomach/Bowel: Normal non-distended stomach. Normal caliber small bowel with no small bowel wall thickening. Normal candidate appendix. Questionable focal annular wall thickening versus under distention in the ascending colon (series 3/image 73). Otherwise normal large bowel. Vascular/Lymphatic: Atherosclerotic nonaneurysmal abdominal aorta. Patent portal, splenic, hepatic and renal veins. No pathologically enlarged lymph nodes in the abdomen or pelvis. Reproductive: Enlarged uterus with coarse scattered internal calcifications and multiple uterine masses, largest 5.6 cm anteriorly, presumably uterine fibroids. No adnexal masses. Other: No pneumoperitoneum, ascites or focal fluid collection. Musculoskeletal: No aggressive appearing focal osseous lesions. Marked lumbar spondylosis. IMPRESSION: 1. Numerous (at least 8) solid irregular pulmonary nodules scattered throughout the lungs bilaterally, largest 2.0 cm in the right upper lobe, compatible with pulmonary metastases. 2. Inner right breast irregular 3.0 x 1.9 cm soft tissue mass, cannot exclude primary breast malignancy. Diagnostic mammographic evaluation recommended at a women's imaging center. 3. Small mildly expansile mixed lytic and sclerotic anterior left  sixth rib lesion, cannot exclude osseous metastasis. Healing subacute posterior right eleventh rib fracture without associated discrete osseous lesion. Mild anterior T8 vertebral compression fracture of uncertain chronicity, chronic appearing. 4. No evidence of metastatic disease in the abdomen or pelvis. 5. Questionable focal annular wall thickening versus underdistention in the ascending colon. Correlation with the patient's colon cancer screening history is recommended. If screening is not up-to-date, appropriate screening should be considered. 6. Enlarged uterus with multiple uterine masses, presumably uterine fibroids. 7. Aortic Atherosclerosis (ICD10-I70.0). Electronically Signed   By: Ilona Sorrel M.D.   On: 02/03/2021 17:46     CT Head Wo Contrast  Result Date: 02/03/2021 CLINICAL DATA:  Generalized weakness this morning. Numbness and tingling in the hands. No reported injury. EXAM: CT HEAD WITHOUT CONTRAST TECHNIQUE: Contiguous axial images were obtained from the base of the skull through the vertex without  intravenous contrast. COMPARISON:  None. FINDINGS: Brain: There are similar bilateral frontal lobe masses measuring 2.1 cm on the right (series 3/image 16) and 2.6 cm medially and superiorly on the left (series 3/image 23) with central low-attenuation and soft tissue rim with surrounding prominent bilateral frontal and right temporal white matter hypoattenuation. There is associated bilateral frontal and right temporal sulcal effacement. Scattered small regions of loss of gray-white matter differentiation are noted in bilateral frontal and right temporal lobes. Low-attenuation focus centrally in the right cerebellar hemisphere without significant mass effect. No evidence of parenchymal hemorrhage or extra-axial fluid collection. No midline shift. Cerebral volume is age appropriate. No ventriculomegaly. Vascular: No acute abnormality. Skull: No evidence of calvarial fracture. Sinuses/Orbits: The  visualized paranasal sinuses are essentially clear. Other:  The mastoid air cells are unopacified. IMPRESSION: 1. Bilateral frontal lobe masses, with surrounding prominent bilateral frontal and right temporal white matter greater than cortical hypoattenuation and associated sulcal effacement. Low-attenuation central right cerebellar hemisphere focus. Findings are suspicious for metastatic disease. MRI brain without and with IV contrast is recommended for further evaluation. 2. No evidence of acute intracranial hemorrhage. Critical Value/emergent results were called by telephone at the time of interpretation on 02/03/2021 at 1:18 pm to provider [facility] , who verbally acknowledged these results. Electronically Signed   By: Ilona Sorrel M.D.   On: 02/03/2021 13:22   MR Brain W and Wo Contrast  Result Date: 02/03/2021 CLINICAL DATA:  Metastatic disease follow-up EXAM: MRI HEAD WITHOUT AND WITH CONTRAST TECHNIQUE: Multiplanar, multiecho pulse sequences of the brain and surrounding structures were obtained without and with intravenous contrast. CONTRAST:  81mL GADAVIST GADOBUTROL 1 MMOL/ML IV SOLN COMPARISON:  None. FINDINGS: Brain: There are multiple intracranial metastatic lesions within both hemispheres and in the right cerebellar hemisphere. There is a small amount of associated with the right cerebellar lesion only. No intrinsic hyperintense T1-weighted signal. There is moderate edema surrounding most of the lesions, worst surrounding the lesions that are numbered 11, 9, 4 and 5 below. Lesion sizes and locations: 1. Right cerebellum, 1.4 cm, series 11, image 9 2. Medial right temporal lobe 1.1 cm image 17 3. Inferior left frontal lobe 1.3 cm image 20 4. Right frontal operculum 1.4 cm image 24 5. Posterior right frontal operculum 1.5 cm image 24 6. Left occipital lobe 1.4 cm image 25 7. Left frontal lobe 0.6 cm image 23 8. Lateral left parietal lobe 1.4 cm image 28 9. Right frontal white matter 2.2 cm image 27  10. Right frontal periventricular white matter 1.1 cm image 28 11. Paramedian left frontal lobe 3.2 cm image 35 12. Right parietal lobe 0.8 cm image 33 13. Lateral right parietal lobe 2.0 cm image 37 14. Posteroinferior left lentiform nucleus 0.3 cm image 18 There is no midline shift, hydrocephalus or significant mass effect. Vascular: Normal flow voids. Skull and upper cervical spine: Normal marrow signal. Sinuses/Orbits: Negative. Other: None. IMPRESSION: Multiple intracranial metastatic lesions, the largest of which measures up to 3.2 cm. Moderate multifocal edema without midline shift or significant mass effect. Electronically Signed   By: Ulyses Jarred M.D.   On: 02/03/2021 19:35   CT CHEST ABDOMEN PELVIS W CONTRAST  Result Date: 02/03/2021 CLINICAL DATA:  Seizure. Abnormal head CT suspicious for brain metastases. EXAM: CT CHEST, ABDOMEN, AND PELVIS WITH CONTRAST TECHNIQUE: Multidetector CT imaging of the chest, abdomen and pelvis was performed following the standard protocol during bolus administration of intravenous contrast. CONTRAST:  28mL OMNIPAQUE IOHEXOL 300 MG/ML  SOLN COMPARISON:  None. FINDINGS: CT CHEST FINDINGS Cardiovascular: Normal heart size. No significant pericardial effusion/thickening. Atherosclerotic nonaneurysmal thoracic aorta. Top-normal caliber main pulmonary artery (3.2 cm diameter). No central pulmonary emboli. Mediastinum/Nodes: No discrete thyroid nodules. Unremarkable esophagus. No pathologically enlarged axillary, mediastinal or hilar lymph nodes. Lungs/Pleura: No pneumothorax. No pleural effusion. No acute consolidative airspace disease. Numerous (at least 8) solid irregular pulmonary nodules scattered throughout the lungs bilaterally, largest 2.0 cm in the right upper lobe (series 5/image 42), 2.1 cm in the left lower lobe (series 5/image 78) and 1.4 cm in the left upper lobe (series 5/image 42). Musculoskeletal: Moderate thoracic spondylosis. Mild anterior T8 vertebral  compression fracture of uncertain chronicity, chronic appearing. Healing subacute posterior right eleventh rib fracture without associated discrete osseous lesion. Mildly expansile mixed lytic and sclerotic anterior left sixth rib lesion (series 5/image 100). Inner right breast irregular 3.0 x 1.9 cm soft tissue mass (series 3/image 36). CT ABDOMEN PELVIS FINDINGS Hepatobiliary: Normal liver with no liver mass. Normal gallbladder with no radiopaque cholelithiasis. No biliary ductal dilatation. Pancreas: Normal, with no mass or duct dilation. Spleen: Normal size. No mass. Adrenals/Urinary Tract: Normal adrenals. Normal kidneys with no hydronephrosis and no renal mass. Normal bladder. Stomach/Bowel: Normal non-distended stomach. Normal caliber small bowel with no small bowel wall thickening. Normal candidate appendix. Questionable focal annular wall thickening versus under distention in the ascending colon (series 3/image 73). Otherwise normal large bowel. Vascular/Lymphatic: Atherosclerotic nonaneurysmal abdominal aorta. Patent portal, splenic, hepatic and renal veins. No pathologically enlarged lymph nodes in the abdomen or pelvis. Reproductive: Enlarged uterus with coarse scattered internal calcifications and multiple uterine masses, largest 5.6 cm anteriorly, presumably uterine fibroids. No adnexal masses. Other: No pneumoperitoneum, ascites or focal fluid collection. Musculoskeletal: No aggressive appearing focal osseous lesions. Marked lumbar spondylosis. IMPRESSION: 1. Numerous (at least 8) solid irregular pulmonary nodules scattered throughout the lungs bilaterally, largest 2.0 cm in the right upper lobe, compatible with pulmonary metastases. 2. Inner right breast irregular 3.0 x 1.9 cm soft tissue mass, cannot exclude primary breast malignancy. Diagnostic mammographic evaluation recommended at a women's imaging center. 3. Small mildly expansile mixed lytic and sclerotic anterior left sixth rib lesion, cannot  exclude osseous metastasis. Healing subacute posterior right eleventh rib fracture without associated discrete osseous lesion. Mild anterior T8 vertebral compression fracture of uncertain chronicity, chronic appearing. 4. No evidence of metastatic disease in the abdomen or pelvis. 5. Questionable focal annular wall thickening versus underdistention in the ascending colon. Correlation with the patient's colon cancer screening history is recommended. If screening is not up-to-date, appropriate screening should be considered. 6. Enlarged uterus with multiple uterine masses, presumably uterine fibroids. 7. Aortic Atherosclerosis (ICD10-I70.0). Electronically Signed   By: Ilona Sorrel M.D.   On: 02/03/2021 17:46   Assessment and Plan:   Ms. Galyean is a 70 year old female with no significant past medical history except for osteoarthritis. She presented to the hospital with seizure-like activity in the right arm and leg. MRI findings showed multiple lesions in her brain concerning for metastatic disease and CT scan findings also concerning for metastatic cancer. Biopsy pending. Differentials include metastatic breast cancer versus metastatic lung cancer versus other. She is receiving dexamethasone and Keppra.  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 We discussed that the findings are concerning for metastatic cancer. --Recommend for her to proceed with a biopsy. We are unlikely to obtain a breast biopsy while she is here in  the hospital. Therefore, I have requested a CT-guided lung biopsy to confirm her diagnosis. --Once biopsy is obtained, we can have a more detailed discussion regarding her diagnosis, prognosis, and treatment options. --We also recommend radiation oncology consult for consideration of whole brain radiation. They are aware of this request. --Recommend palliative care consult for goals of care  discussion. --Continue dexamethasone with plans for radiation oncology to taper when she is receiving radiation.  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 --Continue dexamethasone and Keppra.  Thank you for this referral.   Mikey Bussing, DNP, AGPCNP-BC, AOCNP

## 2021-02-05 ENCOUNTER — Ambulatory Visit
Admit: 2021-02-05 | Discharge: 2021-02-05 | Disposition: A | Discharge status: Home / Self Care | Payer: PPO | Attending: Radiation Oncology | Admitting: Radiation Oncology

## 2021-02-05 ENCOUNTER — Inpatient Hospital Stay (HOSPITAL_COMMUNITY): Payer: PPO

## 2021-02-05 DIAGNOSIS — C799 Secondary malignant neoplasm of unspecified site: Secondary | ICD-10-CM | POA: Diagnosis not present

## 2021-02-05 DIAGNOSIS — C7931 Secondary malignant neoplasm of brain: Secondary | ICD-10-CM | POA: Diagnosis not present

## 2021-02-05 DIAGNOSIS — R918 Other nonspecific abnormal finding of lung field: Secondary | ICD-10-CM | POA: Diagnosis not present

## 2021-02-05 LAB — CBC
HCT: 27.7 % — ABNORMAL LOW (ref 36.0–46.0)
Hemoglobin: 8.5 g/dL — ABNORMAL LOW (ref 12.0–15.0)
MCH: 21.9 pg — ABNORMAL LOW (ref 26.0–34.0)
MCHC: 30.7 g/dL (ref 30.0–36.0)
MCV: 71.2 fL — ABNORMAL LOW (ref 80.0–100.0)
Platelets: 296 10*3/uL (ref 150–400)
RBC: 3.89 MIL/uL (ref 3.87–5.11)
RDW: 16.1 % — ABNORMAL HIGH (ref 11.5–15.5)
WBC: 9.5 10*3/uL (ref 4.0–10.5)
nRBC: 0 % (ref 0.0–0.2)

## 2021-02-05 MED ORDER — LIDOCAINE HCL (PF) 1 % IJ SOLN
INTRAMUSCULAR | Status: AC
  Filled 2021-02-05: qty 30

## 2021-02-05 MED ORDER — SENNA 8.6 MG PO TABS
1.0000 | ORAL_TABLET | Freq: Every evening | ORAL | Status: DC | PRN
  Administered 2021-02-06: 8.6 mg via ORAL
  Filled 2021-02-05: qty 1

## 2021-02-05 MED ORDER — POLYETHYLENE GLYCOL 3350 17 G PO PACK: 17.0000 g | PACK | Freq: Every day | ORAL | Status: DC | PRN

## 2021-02-05 NOTE — Procedures (Signed)
Interventional Radiology Procedure Note  Procedure: Right breast mass biopsy   Indication: Metastatic malignancy  Findings: Please refer to procedural dictation for full description.  Complications: None  EBL: < 10 mL  Miachel Roux, MD [phone]

## 2021-02-05 NOTE — Consult Note (Incomplete)
Palliative Medicine Inpatient Consult Note  Reason for consult:  Goals of Care "goals of care , Dx metastatic disease in the brain , lung and breast of unknown primary."  HPI:  Per intake H&P --> 70 y.o.femalewith medical history significant ofosteoarthritis otherwise no significant past medical history. Patient does home physical therapy and while doing session today for her right knee with the physical therapist she had a seizure-like activity of her right arm and leg. No loss of consciousness, no loss of bowel and bladder control.   Clinical Assessment/Goals of Care:  *Please note that this is a verbal dictation therefore any spelling or grammatical errors are due to the "[place] One" system interpretation.  I have reviewed medical records including EPIC notes, labs and imaging, received report from bedside RN, assessed the patient.    I met with *** to further discuss diagnosis prognosis, GOC, EOL wishes, disposition and options.   I introduced Palliative Medicine as specialized medical care for people living with serious illness. It focuses on providing relief from the symptoms and stress of a serious illness. The goal is to improve quality of life for both the patient and the family.  A detailed discussion was had today regarding advanced directives.  Concepts specific to code status, artifical feeding and hydration, continued IV antibiotics and rehospitalization was had.  The difference between a aggressive medical intervention path  and a palliative comfort care path for this patient at this time was had. Values and goals of care important to patient and family were attempted to be elicited  Discussed the importance of continued conversation with family and their  medical providers regarding overall plan of care and treatment options, ensuring decisions are within the context of the patients values and GOCs.  Provided *** "Hard Choices for Loving People" booklet.    Provided *** "Gone From My Site" booklet.  Decision Maker:  SUMMARY OF RECOMMENDATIONS     Code Status/Advance Care Planning: FULL CODE  DNAR/DNI  Modified CODE   Symptom Management:     Palliative Prophylaxis:     Additional Recommendations (Limitations, Scope, Preferences):      Psycho-social/Spiritual:   Desire for further Chaplaincy support:   Additional Recommendations:    Prognosis:   Discharge Planning:   ROS  Oral Intake %:   I/O:   Bowel Movements:   Mobility:   Vitals:   02/05/21 1206 02/05/21 1333  BP: 125/66 127/63  Pulse: 73 89  Resp: 18 18  Temp: 98.4 F (36.9 C) 98.2 F (36.8 C)  SpO2: 99% 99%    Intake/Output Summary (Last 24 hours) at 02/05/2021 1542 Last data filed at 02/05/2021 0845 Gross per 24 hour  Intake 3271.24 ml  Output 950 ml  Net 2321.24 ml     Gen:  NAD HEENT: moist mucous membranes CV: Regular rate and rhythm, no murmurs rubs or gallops PULM: clear to auscultation bilaterally. No wheezes/rales/rhonchi*** ABD: soft/nontender/nondistended/normal bowel sounds*** EXT: No edema*** Neuro: Alert and oriented x3***  PPS:   This conversation/these recommendations were discussed with patient primary care team, Dr. Marland Kitchen  Time In: Time Out: Total Time: *** Greater than 50%  of this time was spent counseling and coordinating care related to the above assessment and plan.  Reform Team Team Cell Phone: [phone] Please utilize secure chat with additional questions, if there is no response within 30 minutes please call the above phone number  Palliative Medicine Team providers are available by phone from [facility]  to 7pm daily and can be reached through the team cell phone.  Should this patient require assistance outside of these hours, please call the patient's attending physician.

## 2021-02-05 NOTE — Progress Notes (Signed)
  Radiation Oncology         [phone] ________________________________  Name: Tammy Larsen MRN: [id number]  Date: 02/05/2021  DOB: 29-Apr-1951  SIMULATION AND TREATMENT PLANNING NOTE    ICD-10-CM   1. Brain metastases ([place])  C79.31     DIAGNOSIS:  70 y.o. female with multiple brain metastases and associated cerebral edema  NARRATIVE:  The patient was brought to the [facility].  Identity was confirmed.  All relevant records and images related to the planned course of therapy were reviewed.  The patient freely provided informed written consent to proceed with treatment after reviewing the details related to the planned course of therapy. The consent form was witnessed and verified by the simulation staff.  Then, the patient was set-up in a stable reproducible  supine position for radiation therapy.  CT images were obtained.  Surface markings were placed.  The CT images were loaded into the planning software.  Then the target and avoidance structures were contoured.  Treatment planning then occurred.  The radiation prescription was entered and confirmed.  Then, I designed and supervised the construction of a total of 3 medically necessary complex treatment devices, including a custom made thermoplastic mask used for immobilization and two complex multileaf collimators to cover the entire intracranial contents, while shielding the eyes and face.  Each [facility] is independently created to account for beam divergence.  The right and left lateral fields will be treated with 6 MV X-rays.  I have requested : Isodose Plan.  PLAN:  The whole brain will be treated to 30 Gy in 10 fractions.  ________________________________  Sheral Apley [name], M.D.  This document serves as a record of services personally performed by Tyler Pita, MD. It was created on his behalf by Wilburn Mylar, a trained medical scribe. The creation of this record is based on the scribe's personal observations and  the provider's statements to them. This document has been checked and approved by the attending provider.

## 2021-02-05 NOTE — Progress Notes (Signed)
PROGRESS NOTE    Tammy Larsen  CVE:[id number] DOB: 05-22-1951 DOA: 02/03/2021 PCP: Pcp, No   Chief Complaint  Patient presents with  . Seizures  . Weakness    Brief Narrative:   70 y.o. female with medical history significant of osteoarthritis otherwise no significant past medical history.  Patient does home physical therapy and while doing session today for her right knee with the physical therapist she had a seizure-like activity of her right arm and leg. No loss of consciousness, no loss of bowel and bladder control. She was brought in to ED,  . CT abdomen and pelvis shows multiple lung metastasis, also 3 x 1.9 cm soft tissue mass in the right breast.  Small lytic and sclerotic lesion in the left sixth rib.  No evidence of metastatic disease in the abdomen or pelvis and evidence of uterine fibroids and possible: Lesion.  Head CT without contrast showed bilateral frontal lobe masses with surrounding prominent bilateral frontal and right temporal white matter greater than cortical hypoattenuation.  No intracranial hemorrhage.  MRI of the brain showed multiple intracranial metastatic lesions largest about 3.2 cm.  There is moderate multifocal edema but no midline shift or significant mass-effect.  Patient has been initiated on Keppra and is being admitted to the hospital for evaluation and treatment. Oncology and radiation oncology consulted for recommendations. She underwent right breast mass biopsy on 02/05/2021. She has been tentatively scheduled for CT SIM/ treatment planning at 3 pm today in anticipation of beginning the treatments early next week once pathology is back before then.       Assessment & Plan:   Principal Problem:   Brain metastases (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  Empirically on anti seizure meds and decadron. Continue with IV keppra and IV decadron.  Neurology consulted by  admitting physician. Seizure precautions.    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 She underwent right breast mass biopsy on 02/05/2021. She has been tentatively scheduled for CT SIM/ treatment planning at 3 pm today in anticipation of beginning the treatments early next week once pathology is back before then.    Anemia probably sec to malignancy.  Anemia panel shows normal iron , folate and VIT b12 levels.  TSH wnl.  Continue to monitor.    palliative care consulted for goals of care discussions. PT/OT evaluations have been ordered.    DVT prophylaxis: (lovenox) Code Status: (Full CODE) Family Communication: (none at bedside) Disposition:   Status is: Inpatient  Remains inpatient appropriate because:Ongoing diagnostic testing needed not appropriate for outpatient work up and Unsafe d/c plan   Dispo: The patient is from: Home              Anticipated d/c is to: pending.              Anticipated d/c date is: 2 days              Patient currently is not medically stable to d/c.   Difficult to place patient No       Consultants:   Neurology  Oncology  Radiation oncology  IR.   PALLIATIVE.   Procedures: breast biopsy on 02/05/21  Antimicrobials:none.    Subjective: No new complaints.   Objective: Vitals:   02/05/21 0725 02/05/21 1103 02/05/21 1206 02/05/21 1333  BP: 137/67 124/61 125/66  127/63  Pulse: 77 74 73 89  Resp: 16 18 18 18   Temp: 97.8 F (36.6 C) 98.8 F (37.1 C) 98.4 F (36.9 C) 98.2 F (36.8 C)  TempSrc: Oral Oral Oral Oral  SpO2: 100% 100% 99% 99%    Intake/Output Summary (Last 24 hours) at 02/05/2021 1504 Last data filed at 02/05/2021 0845 Gross per 24 hour  Intake 3271.24 ml  Output 950 ml  Net 2321.24 ml   There were no vitals filed for this visit.  Examination:  General exam: Alert and comfortable, not in any kind of  distress Respiratory system: Clear to auscultation bilaterally, no wheezing or rhonchi Cardiovascular system: S1-S2 heard, regular rate rhythm, no JVD, no pedal edema Gastrointestinal system: Abdomen is soft, nontender, bowel sounds normal Central nervous system: Alert and able to answer all questions, Extremities: No pedal edema Skin: No rashes seen Psychiatry: Mood is appropriate   Data Reviewed: I have personally reviewed following labs and imaging studies  CBC: Recent Labs  Lab 02/03/21 1204 02/03/21 2212 02/04/21 0222 02/05/21 0228  WBC 8.4 8.1 5.9 9.5  HGB 9.4* 10.0* 9.7* 8.5*  HCT 29.6* 32.6* 31.4* 27.7*  MCV 69.6* 70.7* 70.2* 71.2*  PLT 289 324 304 923    Basic Metabolic Panel: Recent Labs  Lab 02/03/21 1204 02/03/21 2212 02/04/21 0222  NA 140  --  137  K 4.1  --  4.1  CL 101  --  101  CO2 26  --  23  GLUCOSE 105*  --  161*  BUN 13  --  11  CREATININE 0.76 0.69 0.66  CALCIUM 9.5  --  9.1    GFR: CrCl cannot be calculated (Unknown ideal weight.).  Liver Function Tests: Recent Labs  Lab 02/04/21 0222  AST 18  ALT 16  ALKPHOS 60  BILITOT 0.6  PROT 6.7  ALBUMIN 3.0*    CBG: Recent Labs  Lab 02/04/21 2036  GLUCAP 159*     Recent Results (from the past 240 hour(s))  SARS CORONAVIRUS 2 (TAT 6-24 HRS) Nasopharyngeal Nasopharyngeal Swab     Status: None   Collection Time: 02/03/21 10:12 PM   Specimen: Nasopharyngeal Swab  Result Value Ref Range Status   SARS Coronavirus 2 NEGATIVE NEGATIVE Final    Comment: (NOTE) SARS-CoV-2 target nucleic acids are NOT DETECTED.  The SARS-CoV-2 RNA is generally detectable in upper and lower respiratory specimens during the acute phase of infection. Negative results do not preclude SARS-CoV-2 infection, do not rule out co-infections with other pathogens, and should not be used as the sole basis for treatment or other patient management decisions. Negative results must be combined with clinical  observations, patient history, and epidemiological information. The expected result is Negative.  Fact Sheet for Patients: SugarRoll.be  Fact Sheet for Healthcare Providers: https://www.woods-mathews.com/  This test is not yet approved or cleared by the [place] FDA and  has been authorized for detection and/or diagnosis of SARS-CoV-2 by FDA under an Emergency Use Authorization (EUA). This EUA will remain  in effect (meaning this test can be used) for the duration of the COVID-19 declaration under Se ction 564(b)(1) of the Act, 21 U.S.C. section 360bbb-3(b)(1), unless the authorization is terminated or revoked sooner.  Performed at [facility], [place] [address], [place], [place] [address]          Radiology Studies: MR Brain W and Wo Contrast  Result Date: 02/03/2021 CLINICAL DATA:  Metastatic disease follow-up EXAM: MRI HEAD WITHOUT AND WITH CONTRAST  TECHNIQUE: Multiplanar, multiecho pulse sequences of the brain and surrounding structures were obtained without and with intravenous contrast. CONTRAST:  28mL GADAVIST GADOBUTROL 1 MMOL/ML IV SOLN COMPARISON:  None. FINDINGS: Brain: There are multiple intracranial metastatic lesions within both hemispheres and in the right cerebellar hemisphere. There is a small amount of associated with the right cerebellar lesion only. No intrinsic hyperintense T1-weighted signal. There is moderate edema surrounding most of the lesions, worst surrounding the lesions that are numbered 11, 9, 4 and 5 below. Lesion sizes and locations: 1. Right cerebellum, 1.4 cm, series 11, image 9 2. Medial right temporal lobe 1.1 cm image 17 3. Inferior left frontal lobe 1.3 cm image 20 4. Right frontal operculum 1.4 cm image 24 5. Posterior right frontal operculum 1.5 cm image 24 6. Left occipital lobe 1.4 cm image 25 7. Left frontal lobe 0.6 cm image 23 8. Lateral left parietal lobe 1.4 cm image 28 9. Right frontal white  matter 2.2 cm image 27 10. Right frontal periventricular white matter 1.1 cm image 28 11. Paramedian left frontal lobe 3.2 cm image 35 12. Right parietal lobe 0.8 cm image 33 13. Lateral right parietal lobe 2.0 cm image 37 14. Posteroinferior left lentiform nucleus 0.3 cm image 18 There is no midline shift, hydrocephalus or significant mass effect. Vascular: Normal flow voids. Skull and upper cervical spine: Normal marrow signal. Sinuses/Orbits: Negative. Other: None. IMPRESSION: Multiple intracranial metastatic lesions, the largest of which measures up to 3.2 cm. Moderate multifocal edema without midline shift or significant mass effect. Electronically Signed   By: Ulyses Jarred M.D.   On: 02/03/2021 19:35   CT CHEST ABDOMEN PELVIS W CONTRAST  Result Date: 02/03/2021 CLINICAL DATA:  Seizure. Abnormal head CT suspicious for brain metastases. EXAM: CT CHEST, ABDOMEN, AND PELVIS WITH CONTRAST TECHNIQUE: Multidetector CT imaging of the chest, abdomen and pelvis was performed following the standard protocol during bolus administration of intravenous contrast. CONTRAST:  35mL OMNIPAQUE IOHEXOL 300 MG/ML  SOLN COMPARISON:  None. FINDINGS: CT CHEST FINDINGS Cardiovascular: Normal heart size. No significant pericardial effusion/thickening. Atherosclerotic nonaneurysmal thoracic aorta. Top-normal caliber main pulmonary artery (3.2 cm diameter). No central pulmonary emboli. Mediastinum/Nodes: No discrete thyroid nodules. Unremarkable esophagus. No pathologically enlarged axillary, mediastinal or hilar lymph nodes. Lungs/Pleura: No pneumothorax. No pleural effusion. No acute consolidative airspace disease. Numerous (at least 8) solid irregular pulmonary nodules scattered throughout the lungs bilaterally, largest 2.0 cm in the right upper lobe (series 5/image 42), 2.1 cm in the left lower lobe (series 5/image 78) and 1.4 cm in the left upper lobe (series 5/image 42). Musculoskeletal: Moderate thoracic spondylosis. Mild  anterior T8 vertebral compression fracture of uncertain chronicity, chronic appearing. Healing subacute posterior right eleventh rib fracture without associated discrete osseous lesion. Mildly expansile mixed lytic and sclerotic anterior left sixth rib lesion (series 5/image 100). Inner right breast irregular 3.0 x 1.9 cm soft tissue mass (series 3/image 36). CT ABDOMEN PELVIS FINDINGS Hepatobiliary: Normal liver with no liver mass. Normal gallbladder with no radiopaque cholelithiasis. No biliary ductal dilatation. Pancreas: Normal, with no mass or duct dilation. Spleen: Normal size. No mass. Adrenals/Urinary Tract: Normal adrenals. Normal kidneys with no hydronephrosis and no renal mass. Normal bladder. Stomach/Bowel: Normal non-distended stomach. Normal caliber small bowel with no small bowel wall thickening. Normal candidate appendix. Questionable focal annular wall thickening versus under distention in the ascending colon (series 3/image 73). Otherwise normal large bowel. Vascular/Lymphatic: Atherosclerotic nonaneurysmal abdominal aorta. Patent portal, splenic, hepatic and renal veins. No pathologically enlarged  lymph nodes in the abdomen or pelvis. Reproductive: Enlarged uterus with coarse scattered internal calcifications and multiple uterine masses, largest 5.6 cm anteriorly, presumably uterine fibroids. No adnexal masses. Other: No pneumoperitoneum, ascites or focal fluid collection. Musculoskeletal: No aggressive appearing focal osseous lesions. Marked lumbar spondylosis. IMPRESSION: 1. Numerous (at least 8) solid irregular pulmonary nodules scattered throughout the lungs bilaterally, largest 2.0 cm in the right upper lobe, compatible with pulmonary metastases. 2. Inner right breast irregular 3.0 x 1.9 cm soft tissue mass, cannot exclude primary breast malignancy. Diagnostic mammographic evaluation recommended at a women's imaging center. 3. Small mildly expansile mixed lytic and sclerotic anterior left  sixth rib lesion, cannot exclude osseous metastasis. Healing subacute posterior right eleventh rib fracture without associated discrete osseous lesion. Mild anterior T8 vertebral compression fracture of uncertain chronicity, chronic appearing. 4. No evidence of metastatic disease in the abdomen or pelvis. 5. Questionable focal annular wall thickening versus underdistention in the ascending colon. Correlation with the patient's colon cancer screening history is recommended. If screening is not up-to-date, appropriate screening should be considered. 6. Enlarged uterus with multiple uterine masses, presumably uterine fibroids. 7. Aortic Atherosclerosis (ICD10-I70.0). Electronically Signed   By: Ilona Sorrel M.D.   On: 02/03/2021 17:46   [place] CORE BIOPSY (SOFT TISSUE)  Result Date: 02/05/2021 INDICATION: 70 year old woman with right breast mass, multiple pulmonary nodules, osseous lesions, and multiple intracranial metastatic lesions presents to interventional Radiology for image guided biopsy of right breast mass. Biopsy requested in anticipation of external beam radiation treatment of brain Mets. EXAM: Ultrasound-guided biopsy of right inner breast mass. MEDICATIONS: None. ANESTHESIA/SEDATION: None COMPLICATIONS: None immediate. PROCEDURE: Informed written consent was obtained from the patient after a thorough discussion of the procedural risks, benefits and alternatives. All questions were addressed. Maximal Sterile Barrier Technique was utilized including caps, mask, sterile gowns, sterile gloves, sterile drape, hand hygiene and skin antiseptic. A timeout was performed prior to the initiation of the procedure. Patient position supine on the ultrasound table. Right inner breast skin prepped and draped in usual sterile fashion. Following local lidocaine administration, three 16 gauge cores were obtained from the right breast mass utilizing continuous ultrasound guidance. Samples were sent to pathology in formalin.  Needle removed and hemostasis achieved with 2 minutes of manual compression. IMPRESSION: Ultrasound-guided biopsy of right inner breast mass as above. Electronically Signed   By: Miachel Roux M.D.   On: 02/05/2021 11:01        Scheduled Meds: . dexamethasone (DECADRON) injection  4 mg Intravenous Q6H  . enoxaparin (LOVENOX) injection  40 mg Subcutaneous Q24H  . levETIRAcetam  500 mg Oral BID   Continuous Infusions: . dextrose 5 % and 0.45% NaCl 100 mL/hr at 02/05/21 0416     LOS: 2 days       Hosie Poisson, MD [facility]   To contact the attending provider between 7A-7P or the covering provider during after hours 7P-7A, please log into the web site [email or web address] and access using universal [place] password for that web site. If you do not have the password, please call the hospital operator.  02/05/2021, 3:04 PM

## 2021-02-06 ENCOUNTER — Other Ambulatory Visit: Payer: Self-pay

## 2021-02-06 DIAGNOSIS — C7931 Secondary malignant neoplasm of brain: Secondary | ICD-10-CM | POA: Diagnosis not present

## 2021-02-06 DIAGNOSIS — C799 Secondary malignant neoplasm of unspecified site: Secondary | ICD-10-CM | POA: Diagnosis not present

## 2021-02-06 DIAGNOSIS — R918 Other nonspecific abnormal finding of lung field: Secondary | ICD-10-CM | POA: Diagnosis not present

## 2021-02-06 LAB — BASIC METABOLIC PANEL
Anion gap: 11 (ref 5–15)
BUN: 18 mg/dL (ref 8–23)
CO2: 21 mmol/L — ABNORMAL LOW (ref 22–32)
Calcium: 8.7 mg/dL — ABNORMAL LOW (ref 8.9–10.3)
Chloride: 109 mmol/L (ref 98–111)
Creatinine, Ser: 0.77 mg/dL (ref 0.44–1.00)
GFR, Estimated: >60 mL/min (ref >60)
Glucose, Bld: 176 mg/dL — ABNORMAL HIGH (ref 70–99)
Potassium: 3.6 mmol/L (ref 3.5–5.1)
Sodium: 141 mmol/L (ref 135–145)

## 2021-02-06 LAB — CBC WITH DIFFERENTIAL/PLATELET
Abs Immature Granulocytes: 0.1 10*3/uL — ABNORMAL HIGH (ref 0.00–0.07)
Basophils Absolute: 0 10*3/uL (ref 0.0–0.1)
Basophils Relative: 0 %
Eosinophils Absolute: 0 10*3/uL (ref 0.0–0.5)
Eosinophils Relative: 0 %
HCT: 30 % — ABNORMAL LOW (ref 36.0–46.0)
Hemoglobin: 9.1 g/dL — ABNORMAL LOW (ref 12.0–15.0)
Immature Granulocytes: 1 %
Lymphocytes Relative: 5 %
Lymphs Abs: 0.6 10*3/uL — ABNORMAL LOW (ref 0.7–4.0)
MCH: 21.7 pg — ABNORMAL LOW (ref 26.0–34.0)
MCHC: 30.3 g/dL (ref 30.0–36.0)
MCV: 71.4 fL — ABNORMAL LOW (ref 80.0–100.0)
Monocytes Absolute: 0.2 10*3/uL (ref 0.1–1.0)
Monocytes Relative: 1 %
Neutro Abs: 11.1 10*3/uL — ABNORMAL HIGH (ref 1.7–7.7)
Neutrophils Relative %: 93 %
Platelets: 295 10*3/uL (ref 150–400)
RBC: 4.2 MIL/uL (ref 3.87–5.11)
RDW: 16.2 % — ABNORMAL HIGH (ref 11.5–15.5)
WBC: 11.9 10*3/uL — ABNORMAL HIGH (ref 4.0–10.5)
nRBC: 0 % (ref 0.0–0.2)

## 2021-02-06 LAB — MAGNESIUM: Magnesium: 1.8 mg/dL (ref 1.7–2.4)

## 2021-02-06 NOTE — Progress Notes (Signed)
PIV consult: L upper arm edematous. Infiltration of previous PIV site. Noted restricted arm band on R arm. Discussed with RN: no surgical history to warrant restricted RUE. Noted breast mass and plan for biopsy. Pink arm band removed, IV site established.

## 2021-02-06 NOTE — Evaluation (Signed)
Physical Therapy Evaluation Patient Details Name: Tammy Larsen MRN: [id number] DOB: [birth year] Today's Date: 02/06/2021   History of Present Illness  70 y.o. female with medical history significant of osteoarthritis otherwise no significant past medical history. Pt experienced seizure-like activity on 02/03/2021 when working with [place]. CT abdomen and pelvis demonstrate multiple lung mets and R breast mass. Head CT demonstrates bilateral frontal lobe masses. Pt underwent R breast mass biopsy on 02/05/2021.  Clinical Impression  Pt presents to PT with deficits in functional mobility, ROM, strength, power, cognition, balance, endurance. Pt with significant R sided weakness and ROM deficits in RLE. Pt also demonstrates some R inattention, not utilizing RUE at times without PT cues to do so. Pt is unable to transfer out of bed at this time due to significant weakness and imbalance. Pt will benefit from continued acute PT POC to improve functional mobility quality and to reduce caregiver burden. PT recommends SNF placement at this time due to insufficient caregiver support in the home and a high falls risk. If the patient is to discharge home she will benefit from having a hospital bed and a mechanical lift.    Follow Up Recommendations SNF    Equipment Recommendations  Hospital bed (mechanical lift)    Recommendations for Other Services       Precautions / Restrictions Precautions Precautions: Fall Restrictions Weight Bearing Restrictions: No      Mobility  Bed Mobility Overal bed mobility: Needs Assistance Bed Mobility: Supine to Sit;Sit to Supine     Supine to sit: Max assist;HOB elevated Sit to supine: Max assist;HOB elevated        Transfers Overall transfer level: Needs assistance Equipment used: 1 person hand held assist Transfers: Sit to/from Stand Sit to Stand: Max assist;From elevated surface         General transfer comment: PT provides knee block and BUE support. Pt  with some inattention to RUE at times  Ambulation/Gait                Stairs            Wheelchair Mobility    Modified Rankin (Stroke Patients Only)       Balance Overall balance assessment: Needs assistance Sitting-balance support: Single extremity supported;Bilateral upper extremity supported;Feet supported Sitting balance-Leahy Scale: Poor Sitting balance - Comments: reliant on UE support of bed, minG   Standing balance support: Bilateral upper extremity supported Standing balance-Leahy Scale: Zero Standing balance comment: maxA, posterior lean                             Pertinent Vitals/Pain Pain Assessment: 0-10 Pain Score: 6  Pain Location: R knee with mobility Pain Descriptors / Indicators: Aching Pain Intervention(s): Monitored during session    Home Living Family/patient expects to be discharged to:: Private residence Living Arrangements: Other relatives (brother) Available Help at Discharge: Available PRN/intermittently (brother works afternoon/nights, niece PRN) Type of Home: House Home Access: Ramped entrance     Home Layout: Multi-level;Able to live on main level with bedroom/bathroom Home Equipment: Kasandra Knudsen - single point;Walker - 2 wheels;Wheelchair - manual      Prior Function Level of Independence: Needs assistance   Gait / Transfers Assistance Needed: pt reports she was able to walk around the house short distances with a RW just prior to christmas. Since she has had great difficulty mobilizing and requires assistance for all mobility off the couch. Was recently working with [place] on  attempting to stand upright  ADL's / Homemaking Assistance Needed: pt requires assistance with all IADLs, is able to feed herself.        Hand Dominance        Extremity/Trunk Assessment   Upper Extremity Assessment Upper Extremity Assessment: RUE deficits/detail;LUE deficits/detail RUE Deficits / Details: gorssly 3+/5 LUE Deficits /  Details: grossly 4-/5    Lower Extremity Assessment Lower Extremity Assessment: RLE deficits/detail;LLE deficits/detail RLE Deficits / Details: R ankle lacking ~5-10 degrees dorsiflexion, knee lacking significant flexion formal measurement deferred due to pain with PROM. RLE DF/PF 3/5, knee extension and hip flexion 2+/5 LLE Deficits / Details: grossly 4-/5    Cervical / Trunk Assessment Cervical / Trunk Assessment: Kyphotic  Communication   Communication: No difficulties  Cognition Arousal/Alertness: Awake/alert Behavior During Therapy: WFL for tasks assessed/performed Overall Cognitive Status: Impaired/Different from baseline Area of Impairment: Attention;Memory;Following commands;Safety/judgement;Awareness;Problem solving                   Current Attention Level: Selective Memory: Decreased recall of precautions Following Commands: Follows one step commands consistently Safety/Judgement: Decreased awareness of safety;Decreased awareness of deficits Awareness: Emergent Problem Solving: Slow processing;Requires verbal cues;Requires tactile cues        General Comments General comments (skin integrity, edema, etc.): VSS on RA, pt reports some dizziness, has a history of vertigo per pt report    Exercises     Assessment/Plan    PT Assessment Patient needs continued PT services  PT Problem List Decreased strength;Decreased activity tolerance;Decreased balance;Decreased mobility;Decreased range of motion;Decreased cognition;Decreased knowledge of use of DME;Decreased safety awareness;Decreased knowledge of precautions       PT Treatment Interventions DME instruction;Functional mobility training;Therapeutic activities;Therapeutic exercise;Balance training;Neuromuscular re-education;Cognitive remediation;Patient/family education    PT Goals (Current goals can be found in the Care Plan section)  Acute Rehab PT Goals Patient Stated Goal: to go to rehab to improve  strength and ability to stand PT Goal Formulation: With patient/family Time For Goal Achievement: 02/20/21 Potential to Achieve Goals: Fair    Frequency Min 2X/week   Barriers to discharge        Co-evaluation               AM-PAC PT "6 Clicks" Mobility  Outcome Measure Help needed turning from your back to your side while in a flat bed without using bedrails?: A Lot Help needed moving from lying on your back to sitting on the side of a flat bed without using bedrails?: A Lot Help needed moving to and from a bed to a chair (including a wheelchair)?: Total Help needed standing up from a chair using your arms (e.g., wheelchair or bedside chair)?: A Lot Help needed to walk in hospital room?: Total Help needed climbing 3-5 steps with a railing? : Total 6 Click Score: 9    End of Session   Activity Tolerance: Patient tolerated treatment well Patient left: in bed;with call bell/phone within reach;with bed alarm set Nurse Communication: Mobility status;Need for lift equipment PT Visit Diagnosis: Unsteadiness on feet (R26.81);Muscle weakness (generalized) (M62.81);Difficulty in walking, not elsewhere classified (R26.2);Other symptoms and signs involving the nervous system (R29.898);Pain Pain - Right/Left: Right Pain - part of body: Knee    Time: 2703-5009 PT Time Calculation (min) (ACUTE ONLY): 24 min   Charges:   PT Evaluation $PT Eval Moderate Complexity: 1 Mod PT Treatments $Therapeutic Activity: 8-22 mins        Zenaida Niece, PT, DPT Acute Rehabilitation Pager: [phone]  Zenaida Niece 02/06/2021, 12:46 PM

## 2021-02-06 NOTE — Progress Notes (Signed)
PROGRESS NOTE    Tammy Larsen  UXL:[id number] DOB: Jan 27, 1951 DOA: 02/03/2021 PCP: Pcp, No   Chief Complaint  Patient presents with  . Seizures  . Weakness    Brief Narrative:   70 y.o. female with medical history significant of osteoarthritis otherwise no significant past medical history.  Patient does home physical therapy and while doing session today for her right knee with the physical therapist she had a seizure-like activity of her right arm and leg. No loss of consciousness, no loss of bowel and bladder control. She was brought in to ED,  . CT abdomen and pelvis shows multiple lung metastasis, also 3 x 1.9 cm soft tissue mass in the right breast.  Small lytic and sclerotic lesion in the left sixth rib.  No evidence of metastatic disease in the abdomen or pelvis and evidence of uterine fibroids and possible: Lesion.  Head CT without contrast showed bilateral frontal lobe masses with surrounding prominent bilateral frontal and right temporal white matter greater than cortical hypoattenuation.  No intracranial hemorrhage.  MRI of the brain showed multiple intracranial metastatic lesions largest about 3.2 cm.  There is moderate multifocal edema but no midline shift or significant mass-effect.  Patient has been initiated on Keppra and is being admitted to the hospital for evaluation and treatment. Oncology and radiation oncology consulted for recommendations. She underwent right breast mass biopsy on 02/05/2021. She has been tentatively scheduled for CT SIM/ treatment planning at 3 pm today in anticipation of beginning the treatments early next week once pathology is back before then.  Pt seen and examined at bedside, with brother. Answered all his questions. No new complaints.       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  Empirically on anti  seizure meds and decadron. Continue with Keppra and Decadron. Neurology consulted by admitting physician. Seizure precautions.    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 She underwent right breast mass biopsy on 02/05/2021. She has been tentatively scheduled for CT SIM/ treatment planning at 3 pm today in anticipation of beginning the treatments early next week once pathology is back before then.  Pain control Patient to be transferred to [facility] for radiation treatment starting on Monday.   Anemia probably sec to malignancy.  Anemia panel shows normal iron , folate and VIT b12 levels.  TSH wnl.  Continue to monitor.    Mild leukocytosis probably secondary to Decadron.    palliative care consulted for goals of care discussions. PT/OT evaluations have been ordered.  Therapy evaluations recommending SNF, TOC on board   DVT prophylaxis: (lovenox) Code Status: (Full CODE) Family Communication: Brother at bedside disposition:   Status is: Inpatient  Remains inpatient appropriate because:Ongoing diagnostic testing needed not appropriate for outpatient work up and Unsafe d/c plan   Dispo: The patient is from: Home              Anticipated d/c is to: SNF              Anticipated d/c date is: 2 days              Patient currently is not medically stable to d/c.   Difficult to place patient No       Consultants:   Neurology  Oncology  Radiation oncology  IR.   PALLIATIVE.   Procedures: breast biopsy on 02/05/21  Antimicrobials:none.    Subjective: No chest pain, shortness of breath, nausea vomiting or abdominal pain  Objective: Vitals:   02/06/21 0418 02/06/21 0714 02/06/21 0715 02/06/21 1210  BP: 138/71  132/71 130/63  Pulse: 73  60 79  Resp: 18  16 18   Temp: 98.2 F (36.8 C) 97.9 F (36.6 C)  97.9 F (36.6 C)  TempSrc: Oral Oral  Oral  SpO2: 95%  100%  100%    Intake/Output Summary (Last 24 hours) at 02/06/2021 1551 Last data filed at 02/06/2021 0900 Gross per 24 hour  Intake 1479.42 ml  Output 300 ml  Net 1179.42 ml   There were no vitals filed for this visit.  Examination:  General exam: Alert and comfortable, not in distress Respiratory system: Clear to auscultation no wheezing or rhonchi Cardiovascular system: S1-S2 heard, regular rate rhythm, no JVD Gastrointestinal system: Abdomen is soft nontender nondistended bowel sounds normal Central nervous system: Alert and able to answer questions Extremities: No pedal edema Skin: No rashes seen Psychiatry: Mood is appropriate   Data Reviewed: I have personally reviewed following labs and imaging studies  CBC: Recent Labs  Lab 02/03/21 1204 02/03/21 2212 02/04/21 0222 02/05/21 0228 02/06/21 0920  WBC 8.4 8.1 5.9 9.5 11.9*  NEUTROABS  --   --   --   --  11.1*  HGB 9.4* 10.0* 9.7* 8.5* 9.1*  HCT 29.6* 32.6* 31.4* 27.7* 30.0*  MCV 69.6* 70.7* 70.2* 71.2* 71.4*  PLT 289 324 304 296 945    Basic Metabolic Panel: Recent Labs  Lab 02/03/21 1204 02/03/21 2212 02/04/21 0222 02/06/21 0920  NA 140  --  137 141  K 4.1  --  4.1 3.6  CL 101  --  101 109  CO2 26  --  23 21*  GLUCOSE 105*  --  161* 176*  BUN 13  --  11 18  CREATININE 0.76 0.69 0.66 0.77  CALCIUM 9.5  --  9.1 8.7*  MG  --   --   --  1.8    GFR: CrCl cannot be calculated (Unknown ideal weight.).  Liver Function Tests: Recent Labs  Lab 02/04/21 0222  AST 18  ALT 16  ALKPHOS 60  BILITOT 0.6  PROT 6.7  ALBUMIN 3.0*    CBG: Recent Labs  Lab 02/04/21 2036  GLUCAP 159*     Recent Results (from the past 240 hour(s))  SARS CORONAVIRUS 2 (TAT 6-24 HRS) Nasopharyngeal Nasopharyngeal Swab     Status: None   Collection Time: 02/03/21 10:12 PM   Specimen: Nasopharyngeal Swab  Result Value Ref Range Status   SARS Coronavirus 2 NEGATIVE NEGATIVE Final    Comment: (NOTE) SARS-CoV-2 target nucleic  acids are NOT DETECTED.  The SARS-CoV-2 RNA is generally detectable in upper and lower respiratory specimens during the acute phase of infection. Negative results do not preclude SARS-CoV-2 infection, do not rule out co-infections with other pathogens, and should not be used as the sole basis for treatment or other patient management decisions. Negative results must be combined with clinical observations, patient history, and epidemiological information. The expected result is Negative.  Fact Sheet for Patients: SugarRoll.be  Fact Sheet for Healthcare Providers: https://www.woods-mathews.com/  This test is not yet approved or cleared by the [place] FDA and  has been authorized for detection and/or diagnosis of SARS-CoV-2 by FDA under an Emergency Use Authorization (EUA). This EUA  will remain  in effect (meaning this test can be used) for the duration of the COVID-19 declaration under Se ction 564(b)(1) of the Act, 21 U.S.C. section 360bbb-3(b)(1), unless the authorization is terminated or revoked sooner.  Performed at [facility], [place] [address] [place], [place] [address]          Radiology Studies: [place] CORE BIOPSY (SOFT TISSUE)  Result Date: 02/05/2021 INDICATION: 70 year old woman with right breast mass, multiple pulmonary nodules, osseous lesions, and multiple intracranial metastatic lesions presents to interventional Radiology for image guided biopsy of right breast mass. Biopsy requested in anticipation of external beam radiation treatment of brain Mets. EXAM: Ultrasound-guided biopsy of right inner breast mass. MEDICATIONS: None. ANESTHESIA/SEDATION: None COMPLICATIONS: None immediate. PROCEDURE: Informed written consent was obtained from the patient after a thorough discussion of the procedural risks, benefits and alternatives. All questions were addressed. Maximal Sterile Barrier Technique was utilized including caps,  mask, sterile gowns, sterile gloves, sterile drape, hand hygiene and skin antiseptic. A timeout was performed prior to the initiation of the procedure. Patient position supine on the ultrasound table. Right inner breast skin prepped and draped in usual sterile fashion. Following local lidocaine administration, three 16 gauge cores were obtained from the right breast mass utilizing continuous ultrasound guidance. Samples were sent to pathology in formalin. Needle removed and hemostasis achieved with 2 minutes of manual compression. IMPRESSION: Ultrasound-guided biopsy of right inner breast mass as above. Electronically Signed   By: Miachel Roux M.D.   On: 02/05/2021 11:01        Scheduled Meds: . dexamethasone (DECADRON) injection  4 mg Intravenous Q6H  . enoxaparin (LOVENOX) injection  40 mg Subcutaneous Q24H  . levETIRAcetam  500 mg Oral BID   Continuous Infusions:    LOS: 3 days       Hosie Poisson, MD [facility]   To contact the attending provider between 7A-7P or the covering provider during after hours 7P-7A, please log into the web site [email or web address] and access using universal [place] password for that web site. If you do not have the password, please call the hospital operator.  02/06/2021, 3:51 PM

## 2021-02-07 ENCOUNTER — Encounter (HOSPITAL_COMMUNITY): Payer: Self-pay | Admitting: Internal Medicine

## 2021-02-07 DIAGNOSIS — C7931 Secondary malignant neoplasm of brain: Secondary | ICD-10-CM | POA: Diagnosis not present

## 2021-02-07 NOTE — Evaluation (Signed)
Occupational Therapy Evaluation Patient Details Name: Tammy Larsen MRN: [id number] DOB: [birth year] Today's Date: 02/07/2021    History of Present Illness 70 y.o. female with medical history significant of osteoarthritis otherwise no significant past medical history. Pt experienced seizure-like activity on 02/03/2021 when working with [place]. CT abdomen and pelvis demonstrate multiple lung mets and R breast mass. Head CT demonstrates bilateral frontal lobe masses. Pt underwent R breast mass biopsy on 02/05/2021. Has been transferred to [facility] for radiation.   Clinical Impression   Tammy Larsen is a 70 year old woman with above medical history who presents with decreased ROM and strength of right upper extremity and right lower extremity, generalized weakness, decreased activity tolerance, pain in right knee and mild right inattention. Patient's cognition decreased too - patient able to answer questions, but repeats herself and has questionable insight into her deficits. At this time patient required ADLs to be at bed level. Is able to feed herself and groom herself with setup - has a tendency to use left hand over right. Patient unable to stand today and required max assist for bed mobility. Patient total assist for toileting after incontinent BM. Patient prior hx of right knee arthritis and pain continue to significantly impair her ability along with her new deficits. Patient will benefit from skilled OT services while in hospital to improve deficits and learn compensatory strategies as needed in order to improve functional abilities and reduce caregiver burden. Patient will need rehab and 24/7 assistance at discharge.    Follow Up Recommendations  SNF    Equipment Recommendations  None recommended by OT    Recommendations for Other Services       Precautions / Restrictions Precautions Precautions: Fall      Mobility Bed Mobility Overal bed mobility: Needs Assistance Bed Mobility:  Rolling;Supine to Sit;Sit to Supine Rolling: Mod assist;Max assist (max assist to roll to the left, mod assist to roll the right)   Supine to sit: Max assist;HOB elevated Sit to supine: Max assist        Transfers                 General transfer comment: Incontinent of BM derailed standing.    Balance Overall balance assessment: Needs assistance Sitting-balance support: No upper extremity supported Sitting balance-Leahy Scale: Good Sitting balance - Comments: able to maintain upright sitting despite two lateral pushes.                                   ADL either performed or assessed with clinical judgement   ADL Overall ADL's : Needs assistance/impaired Eating/Feeding: Set up;Bed level   Grooming: Set up;Bed level   Upper Body Bathing: Set up;Bed level   Lower Body Bathing: Maximal assistance;Bed level   Upper Body Dressing : Moderate assistance;Bed level   Lower Body Dressing: Total assistance;Bed level     Toilet Transfer Details (indicate cue type and reason): unable Toileting- Clothing Manipulation and Hygiene: Total assistance;Bed level               Vision   Vision Assessment?: No apparent visual deficits     Perception     Praxis      Pertinent Vitals/Pain Pain Assessment: Faces Faces Pain Scale: Hurts a little bit Pain Location: R knee with mobility Pain Descriptors / Indicators: Aching;Grimacing Pain Intervention(s): Limited activity within patient's tolerance;Monitored during session     Hand Dominance Right  Extremity/Trunk Assessment Upper Extremity Assessment Upper Extremity Assessment: RUE deficits/detail;LUE deficits/detail RUE Deficits / Details: Gross functional ROM 3+/5 strength in shoulder. reports history of shoulder difficulty and elbow "issues" 3+/5 bicep strength, 4-/5 tricep strength, wrist issues too - grossly functional ROM to neutral 4+/5 strength, 4-/5 grip RUE Sensation:  (reports normal  sensation) RUE Coordination:  (gross functional finger to thumb and thumb to nose- slow and needing verbal cues for each rep.) LUE Deficits / Details: WFL ROM, 4/5 strength in shoulder, bicep, tricep, 5/5 wrist, 4-/5 grip LUE Sensation: WNL LUE Coordination:  (grossly functional FMC, difficulty following commands to perform finger to thumb, functional finger to nose  - though needed verbal to perform each rep.)   Lower Extremity Assessment Lower Extremity Assessment: Defer to PT evaluation   Cervical / Trunk Assessment Cervical / Trunk Assessment: Kyphotic   Communication Communication Communication: No difficulties   Cognition Arousal/Alertness: Awake/alert Behavior During Therapy: WFL for tasks assessed/performed Overall Cognitive Status: Impaired/Different from baseline Area of Impairment: Attention;Memory;Following commands;Safety/judgement;Awareness;Problem solving                   Current Attention Level: Selective Memory: Decreased recall of precautions;Decreased short-term memory Following Commands: Follows one step commands consistently Safety/Judgement: Decreased awareness of safety;Decreased awareness of deficits Awareness: Emergent Problem Solving: Slow processing;Requires verbal cues;Requires tactile cues General Comments: Repeats herself over and over - with all information.   General Comments       Exercises     Shoulder Instructions      Home Living Family/patient expects to be discharged to:: Skilled nursing facility Living Arrangements: Other relatives (brother)   Type of Home: House Home Access: Ramped entrance     Home Layout: Multi-level;Able to live on main level with bedroom/bathroom               Home Equipment: Kasandra Knudsen - single point;Walker - 2 wheels;Wheelchair - manual          Prior Functioning/Environment Level of Independence: Needs assistance  Gait / Transfers Assistance Needed: pt reports she was able to walk around the  house short distances with a RW just prior to christmas. Since she has had great difficulty mobilizing and requires assistance for all mobility off the couch. Was recently working with [place] on attempting to stand upright ADL's / Homemaking Assistance Needed: pt requires assistance with all IADLs, is able to feed herself.            OT Problem List: Decreased strength;Decreased range of motion;Decreased activity tolerance;Impaired balance (sitting and/or standing);Decreased cognition;Decreased coordination;Decreased safety awareness;Decreased knowledge of use of DME or AE;Decreased knowledge of precautions;Pain;Impaired UE functional use;Obesity      OT Treatment/Interventions: Self-care/ADL training;Therapeutic exercise;DME and/or AE instruction;Therapeutic activities;Balance training;Patient/family education;Neuromuscular education;Cognitive remediation/compensation    OT Goals(Current goals can be found in the care plan section) Acute Rehab OT Goals Patient Stated Goal: to go to rehab to improve strength and ability to stand OT Goal Formulation: With patient Time For Goal Achievement: 02/21/21 Potential to Achieve Goals: Fair  OT Frequency: Min 2X/week   Barriers to D/C:            Co-evaluation              AM-PAC OT "6 Clicks" Daily Activity     Outcome Measure Help from another person eating meals?: A Little Help from another person taking care of personal grooming?: A Little Help from another person toileting, which includes using toliet, bedpan, or urinal?: Total Help from  another person bathing (including washing, rinsing, drying)?: A Lot Help from another person to put on and taking off regular upper body clothing?: A Lot Help from another person to put on and taking off regular lower body clothing?: Total 6 Click Score: 12   End of Session Equipment Utilized During Treatment: Rolling walker Nurse Communication: Mobility status  Activity Tolerance: Patient  tolerated treatment well Patient left: in bed;with call bell/phone within reach;with nursing/sitter in room;with bed alarm set  OT Visit Diagnosis: Other abnormalities of gait and mobility (R26.89);History of falling (Z91.81);Muscle weakness (generalized) (M62.81);Other symptoms and signs involving the nervous system (R29.898);Pain Pain - Right/Left: Right Pain - part of body: Knee                Time: 1420-1448 OT Time Calculation (min): 28 min Charges:  OT General Charges $OT Visit: 1 Visit OT Evaluation $OT Eval Moderate Complexity: 1 Mod OT Treatments $Self Care/Home Management : 8-22 mins  Danna Casella, OTR/L [place] [phone] Pager: [phone]   Lenward Chancellor 02/07/2021, 4:28 PM

## 2021-02-07 NOTE — Progress Notes (Signed)
PROGRESS NOTE    Tammy Larsen  ASN:[id number] DOB: 13-Apr-1951 DOA: 02/03/2021 PCP: Pcp, No   Chief Complaint  Patient presents with  . Seizures  . Weakness  Brief Narrative: 70 year old female with osteoarthritis, no significant PMH, has home PT for right knee, brought to the ED 02/03/2021 after she had seizure-like activity during physical therapy session of her right arm and leg. Patient was found to have multiple lung metastasis, right breast mass lighting and sclerotic lesion in the left sixth rib.  MRI brain showed multiple intracranial metastatic lesion largest 3.2 cm, patient was placed on Keppra, was admitted seen by radiation oncology, hematology oncology underwent right breast biopsy on 2/11 and transferred from [facility] to facility for further treatment.  Subjective:  Aaox3, no new complaints, no headache. 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 for radiation treatment starting Monday.  Continue further plan of care as per radiation oncology/hematology oncology- Dr Lorenso Courier.  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  Seen by neurology on admission.  Mild leukocytosis likely reactive/from Decadron.  Goals of care: Palliative care has been consulted for ongoing [place] discussions. I am not sure if she is aware of the gravity of her illness- hopefully hem-onc/rad on discuussions with ehr will help her understands more.  Deconditioning continue PT OT DC consult possible SNF per PT  Osteoarthritis: Symptomatic management  Hx of Chronic leg wounds/ no open wounds, chronic skin changes in legs  Nutrition: Diet Order            Diet regular Room service appropriate? Yes with  Assist; Fluid consistency: Thin  Diet effective now                DVT prophylaxis: enoxaparin (LOVENOX) injection 40 mg Start: 02/05/21 2200 Code Status:   Code Status: Full Code  Family Communication: plan of care discussed with patient at bedside. Self updatting her brother who is usually at bedside.  Status is: Inpatient Remains inpatient appropriate because:For newly diagnosed metastatic disease needing further management treatment  Dispo: The patient is from: Home              Anticipated d/c is to: SNF              Anticipated d/c date is: > 3 days              Patient currently is not medically stable to d/c.   Difficult to place patient No  Consultants:see note  Procedures:see note  Unresulted Labs (From admission, onward)          Start     Ordered   02/10/21 0500  Creatinine, serum  (enoxaparin (LOVENOX)    CrCl >/= 30 ml/min)  Weekly,   R     Comments: while on enoxaparin therapy    02/03/21 2202          Culture/Microbiology No results found for: SDES, SPECREQUEST, CULT, REPTSTATUS  Other culture-see note  Medications: Scheduled Meds: . dexamethasone (DECADRON) injection  4 mg Intravenous Q6H  . enoxaparin (LOVENOX) injection  40 mg Subcutaneous Q24H  . levETIRAcetam  500 mg Oral BID   Continuous Infusions:  Antimicrobials: Anti-infectives (From admission, onward)  None     Objective: Vitals: Today's Vitals   02/06/21 2135 02/07/21 0124 02/07/21 0224 02/07/21 0614  BP: (!) 155/83 (!) 154/89  (!) 150/95  Pulse: 79 71  (!) 59  Resp: 16 16  16   Temp: 98.8 F (37.1 C) 97.7 F (36.5 C)  (!) 97.5 F (36.4 C)  TempSrc: Oral Oral  Oral  SpO2: 97% 97%  99%  Weight:   71.5 kg   Height:   5' 5.5" (1.664 m)   PainSc:        Intake/Output Summary (Last 24 hours) at 02/07/2021 0801 Last data filed at 02/07/2021 0615 Gross per 24 hour  Intake --  Output 600 ml  Net -600 ml   Filed Weights   02/07/21 0224  Weight: 71.5 kg   Weight change:    Intake/Output from previous day: 02/12 0701 - 02/13 0700 In: -  Out: 600 [Urine:600] Intake/Output this shift: No intake/output data recorded. Filed Weights   02/07/21 0224  Weight: 71.5 kg    Examination: General exam: AAOx3 ,NAD, weak appearing. HEENT:Oral mucosa moist, Ear/Nose WNL grossly,dentition normal. Respiratory system: bilaterally clear,no wheezing or crackles,no use of accessory muscle, non tender. Cardiovascular system: S1 & S2 +, regular, No JVD. Gastrointestinal system: Abdomen soft, NT,ND, BS+. Nervous System:Alert, awake, moving extremities and grossly nonfocal Extremities: chronic skin changes and scars from previous leg wounds in B/L LE, distal peripheral pulses palpable.  Skin: No rashes,no icterus. MSK: Normal muscle bulk,tone, power   Data Reviewed: I have personally reviewed following labs and imaging studies CBC: Recent Labs  Lab 02/03/21 1204 02/03/21 2212 02/04/21 0222 02/05/21 0228 02/06/21 0920  WBC 8.4 8.1 5.9 9.5 11.9*  NEUTROABS  --   --   --   --  11.1*  HGB 9.4* 10.0* 9.7* 8.5* 9.1*  HCT 29.6* 32.6* 31.4* 27.7* 30.0*  MCV 69.6* 70.7* 70.2* 71.2* 71.4*  PLT 289 324 304 296 751   Basic Metabolic Panel: Recent Labs  Lab 02/03/21 1204 02/03/21 2212 02/04/21 0222 02/06/21 0920  NA 140  --  137 141  K 4.1  --  4.1 3.6  CL 101  --  101 109  CO2 26  --  23 21*  GLUCOSE 105*  --  161* 176*  BUN 13  --  11 18  CREATININE 0.76 0.69 0.66 0.77  CALCIUM 9.5  --  9.1 8.7*  MG  --   --   --  1.8   GFR: Estimated Creatinine Clearance: 65.6 mL/min (by C-G formula based on SCr of 0.77 mg/dL). Liver Function Tests: Recent Labs  Lab 02/04/21 0222  AST 18  ALT 16  ALKPHOS 60  BILITOT 0.6  PROT 6.7  ALBUMIN 3.0*   No results for input(s): LIPASE, AMYLASE in the last 168 hours. No results for input(s): AMMONIA in the last 168 hours. Coagulation Profile: No results for input(s): INR, PROTIME in the last 168 hours. Cardiac Enzymes: No  results for input(s): CKTOTAL, CKMB, CKMBINDEX, TROPONINI in the last 168 hours. BNP (last 3 results) No results for input(s): PROBNP in the last 8760 hours. HbA1C: No results for input(s): HGBA1C in the last 72 hours. CBG: Recent Labs  Lab 02/04/21 2036  GLUCAP 159*   Lipid Profile: No results for input(s): CHOL, HDL, LDLCALC, TRIG, CHOLHDL, LDLDIRECT in the last 72 hours. Thyroid Function Tests: Recent Labs    02/04/21 1500  TSH 0.408   Anemia Panel: Recent Labs    02/04/21 1500  VITAMINB12 1,886*  FOLATE 19.0  FERRITIN 431*  TIBC 252  IRON 32  RETICCTPCT 1.3   Sepsis Labs: No results for input(s): PROCALCITON, LATICACIDVEN in the last 168 hours.  Recent Results (from the past 240 hour(s))  SARS CORONAVIRUS 2 (TAT 6-24 HRS) Nasopharyngeal Nasopharyngeal Swab     Status: None   Collection Time: 02/03/21 10:12 PM   Specimen: Nasopharyngeal Swab  Result Value Ref Range Status   SARS Coronavirus 2 NEGATIVE NEGATIVE Final    Comment: (NOTE) SARS-CoV-2 target nucleic acids are NOT DETECTED.  The SARS-CoV-2 RNA is generally detectable in upper and lower respiratory specimens during the acute phase of infection. Negative results do not preclude SARS-CoV-2 infection, do not rule out co-infections with other pathogens, and should not be used as the sole basis for treatment or other patient management decisions. Negative results must be combined with clinical observations, patient history, and epidemiological information. The expected result is Negative.  Fact Sheet for Patients: SugarRoll.be  Fact Sheet for Healthcare Providers: https://www.woods-mathews.com/  This test is not yet approved or cleared by the [place] FDA and  has been authorized for detection and/or diagnosis of SARS-CoV-2 by FDA under an Emergency Use Authorization (EUA). This EUA will remain  in effect (meaning this test can be used) for the duration of  the COVID-19 declaration under Se ction 564(b)(1) of the Act, 21 U.S.C. section 360bbb-3(b)(1), unless the authorization is terminated or revoked sooner.  Performed at [facility], [place] [address] [place], [place] [address]      Radiology Studies: [place] CORE BIOPSY (SOFT TISSUE)  Result Date: 02/05/2021 INDICATION: 70 year old woman with right breast mass, multiple pulmonary nodules, osseous lesions, and multiple intracranial metastatic lesions presents to interventional Radiology for image guided biopsy of right breast mass. Biopsy requested in anticipation of external beam radiation treatment of brain Mets. EXAM: Ultrasound-guided biopsy of right inner breast mass. MEDICATIONS: None. ANESTHESIA/SEDATION: None COMPLICATIONS: None immediate. PROCEDURE: Informed written consent was obtained from the patient after a thorough discussion of the procedural risks, benefits and alternatives. All questions were addressed. Maximal Sterile Barrier Technique was utilized including caps, mask, sterile gowns, sterile gloves, sterile drape, hand hygiene and skin antiseptic. A timeout was performed prior to the initiation of the procedure. Patient position supine on the ultrasound table. Right inner breast skin prepped and draped in usual sterile fashion. Following local lidocaine administration, three 16 gauge cores were obtained from the right breast mass utilizing continuous ultrasound guidance. Samples were sent to pathology in formalin. Needle removed and hemostasis achieved with 2 minutes of manual compression. IMPRESSION: Ultrasound-guided biopsy of right inner breast mass as above. Electronically Signed   By: Miachel Roux M.D.   On: 02/05/2021 11:01     LOS: 4 days   Antonieta Pert, MD [facility]  02/07/2021, 8:01 AM

## 2021-02-08 ENCOUNTER — Ambulatory Visit
Admit: 2021-02-08 | Discharge: 2021-02-08 | Disposition: A | Discharge status: Home / Self Care | Payer: PPO | Attending: Radiation Oncology | Admitting: Radiation Oncology

## 2021-02-08 DIAGNOSIS — Z7189 Other specified counseling: Secondary | ICD-10-CM | POA: Diagnosis not present

## 2021-02-08 DIAGNOSIS — R569 Unspecified convulsions: Secondary | ICD-10-CM

## 2021-02-08 DIAGNOSIS — Z515 Encounter for palliative care: Secondary | ICD-10-CM | POA: Diagnosis not present

## 2021-02-08 DIAGNOSIS — C7931 Secondary malignant neoplasm of brain: Secondary | ICD-10-CM

## 2021-02-08 MED ORDER — LIP MEDEX EX OINT
1.0000 "application " | TOPICAL_OINTMENT | CUTANEOUS | Status: DC | PRN
  Filled 2021-02-08: qty 7

## 2021-02-08 NOTE — Progress Notes (Signed)
PROGRESS NOTE    Tammy Larsen  ZOX:[id number] DOB: [birth year] DOA: 02/03/2021 PCP: Pcp, No   Chief Complaint  Patient presents with  . Seizures  . Weakness  Brief Narrative: 70 year old female with osteoarthritis, no significant PMH, has home PT for right knee, brought to the ED 02/03/2021 after she had seizure-like activity during physical therapy session of her right arm and leg. Patient was found to have multiple lung metastasis, right breast mass lighting and sclerotic lesion in the left sixth rib.  MRI brain showed multiple intracranial metastatic lesion largest 3.2 cm, patient was placed on Keppra, was admitted seen by radiation oncology, hematology oncology underwent right breast biopsy on 2/11 and transferred from [facility] to facility for further treatment.  Subjective: Resting comfortably.  Patient has no new complaints Afebrile overnight.    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  Remains on Keppra and Decadron.  Continue seizure precaution.  Plan is for radiation therapy as above.    Mild leukocytosis likely reactive/from Decadron.  Monitor.  Anemia likely in the setting of malignant disease.  Monitor hemoglobin  Goals of care: Palliative care has been consulted for ongoing [place] discussions. I am not sure if she is aware of the gravity of her illness- hopefully hem-onc/rad on discuussions with her will help her understand more.  Deconditioning PT OT input appreciated will need SNF.    Osteoarthritis: cont symptomatic management  Hx of Chronic leg wounds/ no open wounds,  w/chronic skin changes in legs  Nutrition: Diet Order             Diet regular Room service appropriate? Yes with Assist; Fluid consistency: Thin  Diet effective now                DVT prophylaxis: enoxaparin (LOVENOX) injection 40 mg Start: 02/05/21 2200 Code Status:   Code Status: Full Code  Family Communication: plan of care discussed with patient at bedside. Self updatting her brother who is usually at bedside.  Status is: Inpatient Remains inpatient appropriate because:For newly diagnosed metastatic disease needing further management treatment  Dispo: The patient is from: Home              Anticipated d/c is to: SNF              Anticipated d/c date is: > 3 days              Patient currently is not medically stable to d/c.   Difficult to place patient No  Consultants:see note  Procedures:see note  Unresulted Labs (From admission, onward)          Start     Ordered   02/10/21 0500  Creatinine, serum  (enoxaparin (LOVENOX)    CrCl >/= 30 ml/min)  Weekly,   R     Comments: while on enoxaparin therapy    02/03/21 2202          Culture/Microbiology No results found for: SDES, SPECREQUEST, CULT, REPTSTATUS  Other culture-see note  Medications: Scheduled Meds: . dexamethasone (DECADRON) injection  4 mg Intravenous Q6H  . enoxaparin (LOVENOX) injection  40 mg Subcutaneous Q24H  . levETIRAcetam  500 mg Oral BID   Continuous Infusions:  Antimicrobials: Anti-infectives (From admission, onward)   None     Objective: Vitals: Today's Vitals   02/07/21 1448 02/07/21 2107 02/07/21 2200 02/08/21 0544  BP: (!) 143/75 (!) 146/84  (!) 153/78  Pulse: 72 74  66  Resp: 18 16  16   Temp: 97.7 F (36.5 C) 97.7 F (36.5 C)  97.7 F (36.5 C)  TempSrc: Oral Oral  Oral  SpO2: 100% 98%  100%  Weight:      Height:      PainSc:   0-No pain     Intake/Output Summary (Last 24 hours) at 02/08/2021 0817 Last data filed at 02/08/2021 0546 Gross per 24 hour  Intake 480 ml  Output 900 ml  Net -420 ml   Filed Weights   02/07/21 0224   Weight: 71.5 kg   Weight change:   Intake/Output from previous day: 02/13 0701 - 02/14 0700 In: 480 [P.O.:480] Out: 900 [Urine:900] Intake/Output this shift: No intake/output data recorded. Filed Weights   02/07/21 0224  Weight: 71.5 kg    Examination: General exam: AAO X3, NAD, weak appearing. HEENT:Oral mucosa moist, Ear/Nose WNL grossly, dentition normal. Respiratory system: bilaterally clear,no wheezing or crackles,no use of accessory muscle Cardiovascular system: S1 & S2 +, No JVD,. Gastrointestinal system: Abdomen soft, NT,ND, BS+ Nervous System:Alert, awake, moving extremities and grossly nonfocal Extremities: Chronic hyperpigmentation and skin changes from her previous chronic wound no edema, distal peripheral pulses palpable.  Skin: No rashes,no icterus. MSK: Normal muscle bulk,tone, power   Data Reviewed: I have personally reviewed following labs and imaging studies CBC: Recent Labs  Lab 02/03/21 1204 02/03/21 2212 02/04/21 0222 02/05/21 0228 02/06/21 0920  WBC 8.4 8.1 5.9 9.5 11.9*  NEUTROABS  --   --   --   --  11.1*  HGB 9.4* 10.0* 9.7* 8.5* 9.1*  HCT 29.6* 32.6* 31.4* 27.7* 30.0*  MCV 69.6* 70.7* 70.2* 71.2* 71.4*  PLT 289 324 304 296 263   Basic Metabolic Panel: Recent Labs  Lab 02/03/21 1204 02/03/21 2212 02/04/21 0222 02/06/21 0920  NA 140  --  137 141  K 4.1  --  4.1 3.6  CL 101  --  101 109  CO2 26  --  23 21*  GLUCOSE 105*  --  161* 176*  BUN 13  --  11 18  CREATININE 0.76 0.69 0.66 0.77  CALCIUM 9.5  --  9.1 8.7*  MG  --   --   --  1.8   GFR: Estimated Creatinine Clearance: 65.6 mL/min (by C-G formula based on SCr of 0.77 mg/dL). Liver Function Tests: Recent Labs  Lab 02/04/21 0222  AST 18  ALT 16  ALKPHOS 60  BILITOT 0.6  PROT 6.7  ALBUMIN 3.0*   No results for input(s): LIPASE, AMYLASE in the last 168 hours. No results for input(s): AMMONIA in the last 168 hours. Coagulation Profile: No results for input(s): INR,  PROTIME in the last 168 hours. Cardiac Enzymes: No results for input(s): CKTOTAL, CKMB, CKMBINDEX, TROPONINI in the last 168 hours. BNP (last 3 results) No results for input(s): PROBNP in the last 8760 hours. HbA1C: No results for input(s): HGBA1C in the last 72 hours. CBG: Recent Labs  Lab 02/04/21 2036  GLUCAP 159*   Lipid Profile: No results for input(s): CHOL, HDL, LDLCALC, TRIG, CHOLHDL, LDLDIRECT in the last 72 hours. Thyroid Function Tests: No results for input(s): TSH, T4TOTAL, FREET4, T3FREE, THYROIDAB in the last 72 hours. Anemia Panel: No results for input(s): VITAMINB12, FOLATE,  FERRITIN, TIBC, IRON, RETICCTPCT in the last 72 hours. Sepsis Labs: No results for input(s): PROCALCITON, LATICACIDVEN in the last 168 hours.  Recent Results (from the past 240 hour(s))  SARS CORONAVIRUS 2 (TAT 6-24 HRS) Nasopharyngeal Nasopharyngeal Swab     Status: None   Collection Time: 02/03/21 10:12 PM   Specimen: Nasopharyngeal Swab  Result Value Ref Range Status   SARS Coronavirus 2 NEGATIVE NEGATIVE Final    Comment: (NOTE) SARS-CoV-2 target nucleic acids are NOT DETECTED.  The SARS-CoV-2 RNA is generally detectable in upper and lower respiratory specimens during the acute phase of infection. Negative results do not preclude SARS-CoV-2 infection, do not rule out co-infections with other pathogens, and should not be used as the sole basis for treatment or other patient management decisions. Negative results must be combined with clinical observations, patient history, and epidemiological information. The expected result is Negative.  Fact Sheet for Patients: SugarRoll.be  Fact Sheet for Healthcare Providers: https://www.woods-mathews.com/  This test is not yet approved or cleared by the [place] FDA and  has been authorized for detection and/or diagnosis of SARS-CoV-2 by FDA under an Emergency Use Authorization (EUA). This EUA  will remain  in effect (meaning this test can be used) for the duration of the COVID-19 declaration under Se ction 564(b)(1) of the Act, 21 U.S.C. section 360bbb-3(b)(1), unless the authorization is terminated or revoked sooner.  Performed at [facility], [place] [address], [place], [place] [address]      Radiology Studies: No results found.   LOS: 5 days   Antonieta Pert, MD [facility]  02/08/2021, 8:17 AM

## 2021-02-08 NOTE — Care Management Important Message (Signed)
Important Message  Patient Details IM Letter given to the Patient. Name: Tammy Larsen MRN: [id number] Date of Birth: 1951/05/05   Medicare Important Message Given:  Yes     [name] 02/08/2021, 11:08 AM

## 2021-02-08 NOTE — Progress Notes (Signed)
Patient was set up to start whole brain radiotherapy today with a breast biopsy pending.  The biopsy results are not back yet.  Given the extent of brain metastases and cerebral edema, radiotherapy was initiated as we await biopsy results.

## 2021-02-09 ENCOUNTER — Ambulatory Visit
Admit: 2021-02-09 | Discharge: 2021-02-09 | Disposition: A | Discharge status: Home / Self Care | Payer: PPO | Attending: Radiation Oncology | Admitting: Radiation Oncology

## 2021-02-09 DIAGNOSIS — C799 Secondary malignant neoplasm of unspecified site: Secondary | ICD-10-CM | POA: Diagnosis not present

## 2021-02-09 DIAGNOSIS — Z515 Encounter for palliative care: Secondary | ICD-10-CM

## 2021-02-09 DIAGNOSIS — C7931 Secondary malignant neoplasm of brain: Secondary | ICD-10-CM | POA: Diagnosis not present

## 2021-02-09 LAB — CBC
HCT: 30.7 % — ABNORMAL LOW (ref 36.0–46.0)
Hemoglobin: 9.8 g/dL — ABNORMAL LOW (ref 12.0–15.0)
MCH: 22.2 pg — ABNORMAL LOW (ref 26.0–34.0)
MCHC: 31.9 g/dL (ref 30.0–36.0)
MCV: 69.6 fL — ABNORMAL LOW (ref 80.0–100.0)
Platelets: 259 10*3/uL (ref 150–400)
RBC: 4.41 MIL/uL (ref 3.87–5.11)
RDW: 16.8 % — ABNORMAL HIGH (ref 11.5–15.5)
WBC: 11.2 10*3/uL — ABNORMAL HIGH (ref 4.0–10.5)
nRBC: 0.4 % — ABNORMAL HIGH (ref 0.0–0.2)

## 2021-02-09 LAB — BASIC METABOLIC PANEL
Anion gap: 10 (ref 5–15)
BUN: 27 mg/dL — ABNORMAL HIGH (ref 8–23)
CO2: 23 mmol/L (ref 22–32)
Calcium: 8.1 mg/dL — ABNORMAL LOW (ref 8.9–10.3)
Chloride: 106 mmol/L (ref 98–111)
Creatinine, Ser: 0.64 mg/dL (ref 0.44–1.00)
GFR, Estimated: >60 mL/min (ref >60)
Glucose, Bld: 120 mg/dL — ABNORMAL HIGH (ref 70–99)
Potassium: 4 mmol/L (ref 3.5–5.1)
Sodium: 139 mmol/L (ref 135–145)

## 2021-02-09 NOTE — Progress Notes (Signed)
Daily Progress Note   Patient Name: Tammy Larsen       Date: 02/09/2021 DOB: 1951/05/26  Age: 70 y.o. MRN#: [id number] Attending Physician: Antonieta Pert, MD Primary Care Physician: Pcp, No Admit Date: 02/03/2021  Reason for Consultation/Follow-up: Establishing goals of care  Subjective:  resting in bed. No distress.    Length of Stay: 6  Current Medications: Scheduled Meds:  . dexamethasone (DECADRON) injection  4 mg Intravenous Q6H  . enoxaparin (LOVENOX) injection  40 mg Subcutaneous Q24H  . levETIRAcetam  500 mg Oral BID    Continuous Infusions:   PRN Meds: acetaminophen, lip balm, ondansetron **OR** ondansetron (ZOFRAN) IV, polyethylene glycol, senna  Physical Exam         Awake alert resting in bed Regular work of breathing S 1 S 2  Abdomen not tender Has dry skin No distress   Vital Signs: BP (!) 162/82   Pulse 62   Temp 97.7 F (36.5 C) (Oral)   Resp 20   Ht 5' 5.5" (1.664 m)   Wt 71.5 kg   SpO2 100%   BMI 25.83 kg/m  SpO2: SpO2: 100 % O2 Device: O2 Device: Room Air O2 Flow Rate:    Intake/output summary:   Intake/Output Summary (Last 24 hours) at 02/09/2021 1222 Last data filed at 02/09/2021 9311 Gross per 24 hour  Intake 240 ml  Output 1250 ml  Net -1010 ml   LBM: Last BM Date: 02/07/21 Baseline Weight: Weight: 71.5 kg Most recent weight: Weight: 71.5 kg       Palliative Assessment/Data:    Flowsheet Rows   Flowsheet Row Most Recent Value  Intake Tab   Referral Department Hospitalist  Unit at Time of Referral Oncology Unit  Palliative Care Primary Diagnosis Cancer  Date Notified 02/04/21  Palliative Care Type New Palliative care  Reason for referral Clarify Goals of Care  Date of Admission 02/03/21  Date first seen by Palliative Care  02/08/21  # of days Palliative referral response time 4 Day(s)  # of days IP prior to Palliative referral 1  Clinical Assessment   Palliative Performance Scale Score 40%  Psychosocial & Spiritual Assessment   Palliative Care Outcomes   Patient/Family meeting held? Yes  Who was at the meeting? Patient  Palliative Care Outcomes Clarified goals of care  Patient Active Problem List   Diagnosis Date Noted  . Brain metastases ([place]) 02/03/2021  . Seizure ([place]) 02/03/2021  . Osteoarthritis 02/03/2021    Palliative Care Assessment & Plan   Patient Profile:  70 year old female with osteoarthritis, no significant PMH, has home PT for right knee, brought to the ED 02/03/2021 after she had seizure-like activity during physical therapy session of her right arm and leg. Patient was found to have multiple lung metastasis, right breast mass lighting and sclerotic lesion in the left sixth rib.  MRI brain showed multiple intracranial metastatic lesion largest 3.2 cm, patient was placed on Keppra, was admitted seen by radiation oncology, hematology oncology underwent right breast biopsy on 2/11 and transferred from [facility] to facility for further treatment. 2/14-underwent whole brain radiotherapy first session. Patient also had radiotherapy today.   Assessment:  70 year old with life limiting illness of probable metastatic breast cancer with mets to brain lung and bones.   Recommendations/Plan:   patient endorses full code, full scope care.   Ongoing multi discilipinary discussions regarding patient's current condition and prognosis.    Code Status:    Code Status Orders  (From admission, onward)         Start     Ordered   02/03/21 2202  Full code  Continuous        02/03/21 2202        Code Status History    This patient has a current code status but no historical code status.   Advance Care Planning Activity       Prognosis:   Unable to determine  Discharge  Planning:  To Be Determined  Care plan was discussed with patient.   Thank you for allowing the Palliative Medicine Team to assist in the care of this patient.   Time In: 9 Time Out: 9.25 Total Time  25 Prolonged Time Billed  no       Greater than 50%  of this time was spent counseling and coordinating care related to the above assessment and plan.  [name], MD  Please contact Palliative Medicine Team phone at [phone] for questions and concerns.

## 2021-02-09 NOTE — Consult Note (Signed)
Consultation Note Date: 02/09/2021   Patient Name: Tammy Larsen  DOB: 1951/01/19  MRN: [id number]  Age / Sex: 70 y.o., female  PCP: Pcp, No Referring Physician: Antonieta Pert, MD  Reason for Consultation: Establishing goals of care  HPI/Patient Profile: 70 y.o. female  with past medical history of osteoarthritis admitted on 02/03/2021 with seizure-like activity while undergoing physical therapy.  Work-up subsequently revealed multiple long and intracranial mets with suspicion for right breast masses primary.  She has been evaluated by [facility] as well as oncology and had breast biopsy on 2/11.  She was transitioned to [facility] long for initiation of radiation for multiple (over a dozen) brain lesions.  Palliative consulted for goals of care.      Clinical Assessment and Goals of Care I saw and examined Tammy Larsen today.  She was awake and alert lying in bed.  She was able to participate in conversation but did seem to be mildly confused and repeated stories.  I introduced palliative care as specialized medical care for people living with serious illness. It focuses on providing relief from the symptoms and stress of a serious illness. The goal is to improve quality of life for both the patient and the family.  She reports that the most important things to her are her faith and her family.  She has a brother with whom she lives with who she relies on heavily as well as 2 sisters.  She previously worked at [employer] but is now retired.  She tells me she is always been healthy and has not really gotten much the doctors.  We discussed discovery of her cancer and she became tearful.  She reports that she initially felt a lump a couple of years ago at the beginning of the pandemic but she was afraid to go for care due to concern for Covid and therefore did not have a checked out.  She became very tearful and expressed being sorry  that she did not seek care sooner.  We discussed clinical course as well as wishes moving forward in regard to advanced directives.  Concepts specific to code status and rehospitalization discussed.  We discussed difference between a aggressive medical intervention path and a palliative, comfort focused care path.  Values and goals of care important to patient and family were attempted to be elicited.  We discussed continued changes in her nutrition, cognition, and functional status and we talked about the fact that she has incurable illness. She again became upset with this conversation and focused on the fact that she is going to pray to God for healing.  She relies heavily on her faith and reports knowing that God can heal her if it is His will.  Questions and concerns addressed.   PMT will continue to support holistically.  SUMMARY OF RECOMMENDATIONS   - Full code/Full scope -She reports that she would like her brother to make decisions on her behalf if she cannot make her own medical decisions.  She has multiple siblings who would legally share  decision-making.  I advised that if she wants her brother alone to make decisions, she should complete paperwork to name him as her healthcare power of attorney.  She will consider this. -Ms. Aries is struggling with the severity of her illness.  We discussed the incurable nature of her illness and plan to maximize interventions that are likely to add as much time and quality to her life as possible.  She is open to any and all offered interventions.  She states that she knows that God alone can ultimately provide healing, and she is praying for a miracle.  She is not open at this point to any conversation about possibilities if this does not occur.  Code Status/Advance Care Planning:  Full code  Palliative Prophylaxis:   Delirium Protocol and Frequent Pain Assessment  Additional Recommendations (Limitations, Scope, Preferences):  Full Scope  Treatment  Psycho-social/Spiritual:   Desire for further Chaplaincy support:no  Additional Recommendations: Caregiving  Support/Resources  Prognosis:   Guarded  Discharge Planning: To Be Determined      Primary Diagnoses: Present on Admission: . Osteoarthritis   I have reviewed the medical record, interviewed the patient and family, and examined the patient. The following aspects are pertinent.  Past Medical History:  Diagnosis Date  . Arthritis    Social History   Socioeconomic History  . Marital status: Single    Spouse name: Not on file  . Number of children: 0  . Years of education: Not on file  . Highest education level: Not on file  Occupational History  . Not on file  Tobacco Use  . Smoking status: Never Smoker  . Smokeless tobacco: Never Used  Substance and Sexual Activity  . Alcohol use: Never  . Drug use: Not on file  . Sexual activity: Not on file  Other Topics Concern  . Not on file  Social History Narrative  . Not on file   Social Determinants of Health   Financial Resource Strain: Not on file  Food Insecurity: Not on file  Transportation Needs: Not on file  Physical Activity: Not on file  Stress: Not on file  Social Connections: Not on file   Family History  Problem Relation Age of Onset  . Cancer Neg Hx    Scheduled Meds: . dexamethasone (DECADRON) injection  4 mg Intravenous Q6H  . enoxaparin (LOVENOX) injection  40 mg Subcutaneous Q24H  . levETIRAcetam  500 mg Oral BID   Continuous Infusions: PRN Meds:.acetaminophen, lip balm, ondansetron **OR** ondansetron (ZOFRAN) IV, polyethylene glycol, senna Medications Prior to Admission:  Prior to Admission medications   Medication Sig Start Date End Date Taking? Authorizing Provider  acetaminophen (TYLENOL) 500 MG tablet Take 1,000 mg by mouth every 6 (six) hours as needed for mild pain or headache.   Yes [provider]  calcium carbonate (OS-CAL - DOSED IN MG OF ELEMENTAL  CALCIUM) 1250 (500 Ca) MG tablet Take 1 tablet by mouth daily with breakfast.   Yes [provider]  cholecalciferol (VITAMIN D3) 25 MCG (1000 UNIT) tablet Take 1,000 Units by mouth daily.   Yes [provider]  fexofenadine (ALLEGRA) 180 MG tablet Take 180 mg by mouth daily as needed for allergies or rhinitis.   Yes [provider]  Ginkgo Biloba 120 MG CAPS Take 120 mg by mouth daily.   Yes [provider]  methocarbamol (ROBAXIN) 500 MG tablet Take 500 mg by mouth every 8 (eight) hours as needed for muscle pain. 12/22/20  Yes [provider]   Allergies  Allergen Reactions  . Penicillins Itching   Review of Systems  Constitutional: Positive for activity change and fatigue.  Neurological: Positive for weakness.  Psychiatric/Behavioral: Positive for sleep disturbance.    Physical Exam  General: Alert, awake, in no acute distress.  HEENT: No bruits, no goiter, no JVD Heart: Regular rate and rhythm. No murmur appreciated. Lungs: Good air movement, clear Abdomen: Soft, nontender, nondistended, positive bowel sounds.  Ext: No significant edema Skin: Warm and dry Neuro: Grossly intact, nonfocal.   Vital Signs: BP (!) 162/82   Pulse 62   Temp 97.7 F (36.5 C) (Oral)   Resp 20   Ht 5' 5.5" (1.664 m)   Wt 71.5 kg   SpO2 100%   BMI 25.83 kg/m  Pain Scale: 0-10 POSS *See Group Information*: 1-Acceptable,Awake and alert Pain Score: 0-No pain   SpO2: SpO2: 100 % O2 Device:SpO2: 100 % O2 Flow Rate: .   IO: Intake/output summary:   Intake/Output Summary (Last 24 hours) at 02/09/2021 0301 Last data filed at 02/09/2021 3143 Gross per 24 hour  Intake 240 ml  Output 1250 ml  Net -1010 ml    LBM: Last BM Date: 02/07/21 Baseline Weight: Weight: 71.5 kg Most recent weight: Weight: 71.5 kg     Palliative Assessment/Data:   Flowsheet Rows   Flowsheet Row Most Recent Value  Intake Tab   Referral Department Hospitalist  Unit at  Time of Referral Oncology Unit  Palliative Care Primary Diagnosis Cancer  Date Notified 02/04/21  Palliative Care Type New Palliative care  Reason for referral Clarify Goals of Care  Date of Admission 02/03/21  Date first seen by Palliative Care 02/08/21  # of days Palliative referral response time 4 Day(s)  # of days IP prior to Palliative referral 1  Clinical Assessment   Palliative Performance Scale Score 40%  Psychosocial & Spiritual Assessment   Palliative Care Outcomes   Patient/Family meeting held? Yes  Who was at the meeting? Patient  Palliative Care Outcomes Clarified goals of care      Time In: 1440 Time Out: 1600 Time Total: 80 Greater than 50%  of this time was spent counseling and coordinating care related to the above assessment and plan.  Signed by: Micheline Rough, MD   Please contact Palliative Medicine Team phone at [phone] for questions and concerns.  For individual provider: See Shea Evans

## 2021-02-09 NOTE — Progress Notes (Signed)
Physical Therapy Treatment Patient Details Name: Tammy Larsen MRN: [id number] DOB: 20-Jun-1951 Today's Date: 02/09/2021    History of Present Illness 70 y.o. female with medical history significant of osteoarthritis otherwise no significant past medical history. Pt experienced seizure-like activity on 02/03/2021 when working with [place]. CT abdomen and pelvis demonstrate multiple lung mets and R breast mass. Head CT demonstrates bilateral frontal lobe masses. Pt underwent R breast mass biopsy on 02/05/2021. Has been transferred to [facility] for radiation.    PT Comments    Pt in bed with brother at bedside.  Brother and pt live together.  Niece helps out some with "the girl stuff".General Comments: AxO x 1.5 inconsistant but pleasant and following all commands.  Has poor insight to current medical situation. Assisted OOB was difficult.  General bed mobility comments: pt requires increased time to complete.  Delayed initiationa and brain to motor control deficit esp with moving B LE (R>L) pt attempts to use B UE's to self assist B LE with little success.  Required Total Assist to guide B LE off bed and had to use bed pad to complete scooting.  Once upright EOB pt required Min Assist to prevent posterior LOB.  Poor self righting.  General transfer comment: attempted sit to stand however pt was unable to achieve full upright stance.  Only < 15% achieved despite + 2 side by side assist.  Pt repeated "It's that right knee".  Brother who was present at time stated pt has been non since about Christmas and pt was sleeping on the couch,  So used STEDY to assist from elevated bed to recliner.  RN assisted.  General Gait Details: unable to attempt due to poor standing ability Positioned in recliner to comfort.  Pt will need ST Rehab at SNF prior to returning to home.  Follow Up Recommendations  SNF     Equipment Recommendations  Hospital bed    Recommendations for Other Services       Precautions / Restrictions  Precautions Precautions: Fall Precaution Comments: Brain METS    Mobility  Bed Mobility Overal bed mobility: Needs Assistance Bed Mobility: Rolling;Supine to Sit     Supine to sit: Max assist;Total assist;+2 for physical assistance;+2 for safety/equipment     General bed mobility comments: pt requires increased time to complete.  Delayed initiationa and brain to motor control deficit esp with moving B LE (R>L) pt attempts to use B UE's to self assist B LE with little success.  Required Total Assist to guide B LE off bed and had to use bed pad to complete scooting.  Once upright EOB pt required Min Assist to prevent posterior LOB.  Poor self righting.    Transfers Overall transfer level: Needs assistance               General transfer comment: attempted sit to stand however pt was unable to achieve full upright stance.  Only < 15% achieved despite + 2 side by side assist.  Pt repeated "It's that right knee".  Brother who was present at time stated pt has been non since about Christmas and pt was sleeping on the couch,  So used STEDY to assist from elevated bed to recliner.  RN assisted.  Ambulation/Gait             General Gait Details: unable to attempt due to poor standing ability   Marine scientist  [place] (Stroke Patients Only)       Balance                                            Cognition Arousal/Alertness: Awake/alert Behavior During Therapy: WFL for tasks assessed/performed Overall Cognitive Status: Impaired/Different from baseline                                 General Comments: AxO x 1.5 inconsistant but pleasant and following all commands.  Has poor insight to current medical situation.      Exercises      General Comments        Pertinent Vitals/Pain Pain Assessment: Faces Faces Pain Scale: Hurts a little bit Pain Location: R knee with activity Pain Descriptors /  Indicators: Aching;Grimacing Pain Intervention(s): Monitored during session;Repositioned    Home Living                      Prior Function            PT Goals (current goals can now be found in the care plan section) Progress towards PT goals: Progressing toward goals    Frequency    Min 2X/week      PT Plan Current plan remains appropriate    Co-evaluation              AM-PAC PT "6 Clicks" Mobility   Outcome Measure  Help needed turning from your back to your side while in a flat bed without using bedrails?: Total Help needed moving from lying on your back to sitting on the side of a flat bed without using bedrails?: Total Help needed moving to and from a bed to a chair (including a wheelchair)?: Total Help needed standing up from a chair using your arms (e.g., wheelchair or bedside chair)?: Total Help needed to walk in hospital room?: Total Help needed climbing 3-5 steps with a railing? : Total 6 Click Score: 6    End of Session Equipment Utilized During Treatment: Gait belt Activity Tolerance: Patient limited by fatigue Patient left: in chair;with call bell/phone within reach;with family/visitor present Nurse Communication: Mobility status;Need for lift equipment PT Visit Diagnosis: Unsteadiness on feet (R26.81);Muscle weakness (generalized) (M62.81);Difficulty in walking, not elsewhere classified (R26.2);Other symptoms and signs involving the nervous system (R29.898);Pain Pain - Right/Left: Right Pain - part of body: Knee     Time: 6962-9528 PT Time Calculation (min) (ACUTE ONLY): 24 min  Charges:  $Therapeutic Activity: 23-37 mins                     Rica Koyanagi  PTA Acute  Rehabilitation Services Pager      [phone] Office      [phone]

## 2021-02-09 NOTE — Progress Notes (Signed)
PROGRESS NOTE    Tammy Larsen  YKZ:[id number] DOB: 17-Aug-1951 DOA: 02/03/2021 PCP: Pcp, No   Chief Complaint  Patient presents with  . Seizures  . Weakness  Brief Narrative: 70 year old female with osteoarthritis, no significant PMH, has home PT for right knee, brought to the ED 02/03/2021 after she had seizure-like activity during physical therapy session of her right arm and leg. Patient was found to have multiple lung metastasis, right breast mass lighting and sclerotic lesion in the left sixth rib.  MRI brain showed multiple intracranial metastatic lesion largest 3.2 cm, patient was placed on Keppra, was admitted seen by radiation oncology, hematology oncology underwent right breast biopsy on 2/11 and transferred from [facility] to facility for further treatment. 2/14-underwent whole brain radiotherapy first session  Subjective: Alert awake, not in acute distress. Tolerated radiotherapy yesterday, has no new complaints. 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  Started on whole brain radiation 2/14 by Dr. [name].  Followed by Dr. Lorenso Courier.  Continue current plan steroids.  Awaiting for biopsy results to decide further prognosis/plan of care.  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  Remains on Keppra and Decadron.  Now getting hold brain radiation started- 2/14  Mild leukocytosis likely reactive/from Decadron.  Mild Anemia likely in the setting of malignant disease H&H is stable  Goals of care: Palliative care has been consulted for ongoing [place] discussions.  And appreciate input.  Awaiting for biopsy result regarding further prognosis plan of care, currently on whole brain radiotherapy starting 2/14.  Patient does not seem to understand the gravity of situation, and she seemed to believe that God will cure her disease.Hopefully hem-onc/rad on  discussions with her will help her understand more after biopsy results.  Her brother is wanting to know about prognosis, waiting on biopsy results and further palliative care discussion  Deconditioning continue PT OT, will need a skilled nursing facility.    Osteoarthritis: cont symptomatic management  Hx of Chronic leg wounds/ no open wounds,  w/chronic skin changes in legs  Nutrition: Diet Order            Diet regular Room service appropriate? Yes with Assist; Fluid consistency: Thin  Diet effective now                DVT prophylaxis: enoxaparin (LOVENOX) injection 40 mg Start: 02/05/21 2200 Code Status:   Code Status: Full Code  Family Communication: plan of care discussed with patient at bedside. Self updatting her brother.  Status is: Inpatient Remains inpatient appropriate because:For newly diagnosed metastatic disease needing further management treatment  Dispo: The patient is from: Home              Anticipated d/c is to: SNF pending biopsy result and further plan from radiation/oncology              Anticipated d/c date is: > 3 days              Patient currently is not medically stable to d/c.   Difficult to place patient No  Consultants:see note  Procedures:see note  Unresulted Labs (From admission, onward)          Start     Ordered   02/10/21 0500  Creatinine, serum  (enoxaparin (LOVENOX)    CrCl >/= 30 ml/min)  Weekly,   R     Comments: while on enoxaparin  therapy    02/03/21 2202          Culture/Microbiology No results found for: SDES, SPECREQUEST, CULT, REPTSTATUS  Other culture-see note  Medications: Scheduled Meds: . dexamethasone (DECADRON) injection  4 mg Intravenous Q6H  . enoxaparin (LOVENOX) injection  40 mg Subcutaneous Q24H  . levETIRAcetam  500 mg Oral BID   Continuous Infusions:  Antimicrobials: Anti-infectives (From admission, onward)   None     Objective: Vitals: Today's Vitals   02/08/21 1433 02/08/21 2015 02/08/21  2114 02/09/21 0458  BP: 136/78 (!) 144/76  (!) 162/82  Pulse: 66 71  62  Resp: 15 17  20   Temp: 98 F (36.7 C) 97.9 F (36.6 C)  97.7 F (36.5 C)  TempSrc: Oral Oral  Oral  SpO2: 99% 99%  100%  Weight:      Height:      PainSc:   0-No pain     Intake/Output Summary (Last 24 hours) at 02/09/2021 1048 Last data filed at 02/09/2021 0616 Gross per 24 hour  Intake 240 ml  Output 1250 ml  Net -1010 ml   Filed Weights   02/07/21 0224  Weight: 71.5 kg   Weight change:   Intake/Output from previous day: 02/14 0701 - 02/15 0700 In: 357 [P.O.:357] Out: 1250 [Urine:1250] Intake/Output this shift: No intake/output data recorded. Filed Weights   02/07/21 0224  Weight: 71.5 kg    Examination: General exam: AAOx3, NAD, weak appearing. HEENT:Oral mucosa moist, Ear/Nose WNL grossly, dentition normal. Respiratory system: bilaterally clear,no wheezing or crackles,no use of accessory muscle Cardiovascular system: S1 & S2 +, No JVD,. Gastrointestinal system: Abdomen soft, NT,ND, BS+ Nervous System:Alert, awake, moving extremities and grossly nonfocal Extremities: No edema, chronic hyperkeratotic and chronic skin changes in lower extremities Skin: No rashes,no icterus. MSK: Normal muscle bulk,tone, power  Data Reviewed: I have personally reviewed following labs and imaging studies CBC: Recent Labs  Lab 02/03/21 2212 02/04/21 0222 02/05/21 0228 02/06/21 0920 02/09/21 0542  WBC 8.1 5.9 9.5 11.9* 11.2*  NEUTROABS  --   --   --  11.1*  --   HGB 10.0* 9.7* 8.5* 9.1* 9.8*  HCT 32.6* 31.4* 27.7* 30.0* 30.7*  MCV 70.7* 70.2* 71.2* 71.4* 69.6*  PLT 324 304 296 295 191   Basic Metabolic Panel: Recent Labs  Lab 02/03/21 1204 02/03/21 2212 02/04/21 0222 02/06/21 0920 02/09/21 0542  NA 140  --  137 141 139  K 4.1  --  4.1 3.6 4.0  CL 101  --  101 109 106  CO2 26  --  23 21* 23  GLUCOSE 105*  --  161* 176* 120*  BUN 13  --  11 18 27*  CREATININE 0.76 0.69 0.66 0.77 0.64   CALCIUM 9.5  --  9.1 8.7* 8.1*  MG  --   --   --  1.8  --    GFR: Estimated Creatinine Clearance: 65.6 mL/min (by C-G formula based on SCr of 0.64 mg/dL). Liver Function Tests: Recent Labs  Lab 02/04/21 0222  AST 18  ALT 16  ALKPHOS 60  BILITOT 0.6  PROT 6.7  ALBUMIN 3.0*   No results for input(s): LIPASE, AMYLASE in the last 168 hours. No results for input(s): AMMONIA in the last 168 hours. Coagulation Profile: No results for input(s): INR, PROTIME in the last 168 hours. Cardiac Enzymes: No results for input(s): CKTOTAL, CKMB, CKMBINDEX, TROPONINI in the last 168 hours. BNP (last 3 results) No results for input(s): PROBNP in the  last 8760 hours. HbA1C: No results for input(s): HGBA1C in the last 72 hours. CBG: Recent Labs  Lab 02/04/21 2036  GLUCAP 159*   Lipid Profile: No results for input(s): CHOL, HDL, LDLCALC, TRIG, CHOLHDL, LDLDIRECT in the last 72 hours. Thyroid Function Tests: No results for input(s): TSH, T4TOTAL, FREET4, T3FREE, THYROIDAB in the last 72 hours. Anemia Panel: No results for input(s): VITAMINB12, FOLATE, FERRITIN, TIBC, IRON, RETICCTPCT in the last 72 hours. Sepsis Labs: No results for input(s): PROCALCITON, LATICACIDVEN in the last 168 hours.  Recent Results (from the past 240 hour(s))  SARS CORONAVIRUS 2 (TAT 6-24 HRS) Nasopharyngeal Nasopharyngeal Swab     Status: None   Collection Time: 02/03/21 10:12 PM   Specimen: Nasopharyngeal Swab  Result Value Ref Range Status   SARS Coronavirus 2 NEGATIVE NEGATIVE Final    Comment: (NOTE) SARS-CoV-2 target nucleic acids are NOT DETECTED.  The SARS-CoV-2 RNA is generally detectable in upper and lower respiratory specimens during the acute phase of infection. Negative results do not preclude SARS-CoV-2 infection, do not rule out co-infections with other pathogens, and should not be used as the sole basis for treatment or other patient management decisions. Negative results must be combined with  clinical observations, patient history, and epidemiological information. The expected result is Negative.  Fact Sheet for Patients: SugarRoll.be  Fact Sheet for Healthcare Providers: https://www.woods-mathews.com/  This test is not yet approved or cleared by the [place] FDA and  has been authorized for detection and/or diagnosis of SARS-CoV-2 by FDA under an Emergency Use Authorization (EUA). This EUA will remain  in effect (meaning this test can be used) for the duration of the COVID-19 declaration under Se ction 564(b)(1) of the Act, 21 U.S.C. section 360bbb-3(b)(1), unless the authorization is terminated or revoked sooner.  Performed at [facility], [place] [address], [place], [place] [address]      Radiology Studies: No results found.   LOS: 6 days   Antonieta Pert, MD [facility]  02/09/2021, 10:48 AM

## 2021-02-10 ENCOUNTER — Ambulatory Visit
Admit: 2021-02-10 | Discharge: 2021-02-10 | Disposition: A | Discharge status: Home / Self Care | Payer: PPO | Attending: Radiation Oncology | Admitting: Radiation Oncology

## 2021-02-10 DIAGNOSIS — C7931 Secondary malignant neoplasm of brain: Secondary | ICD-10-CM | POA: Diagnosis not present

## 2021-02-10 LAB — CREATININE, SERUM
Creatinine, Ser: 0.48 mg/dL (ref 0.44–1.00)
GFR, Estimated: >60 mL/min (ref >60)

## 2021-02-10 MED ORDER — ALUM & MAG HYDROXIDE-SIMETH 200-200-20 MG/5ML PO SUSP
30.0000 mL | Freq: Once | ORAL | Status: AC
  Administered 2021-02-10: 30 mL via ORAL
  Filled 2021-02-10: qty 30

## 2021-02-10 NOTE — Progress Notes (Signed)
Occupational Therapy Treatment Patient Details Name: Tammy Larsen MRN: [id number] DOB: [birth year] Today's Date: 02/10/2021    History of present illness 70 y.o. female with medical history significant of osteoarthritis otherwise no significant past medical history. Pt experienced seizure-like activity on 02/03/2021 when working with [place]. CT abdomen and pelvis demonstrate multiple lung mets and R breast mass. Head CT demonstrates bilateral frontal lobe masses. Pt underwent R breast mass biopsy on 02/05/2021. Has been transferred to [facility] for radiation.   OT comments  Patient is tangential and distractible needing max cues to complete bed level exercises listed below. Max cues for sequencing bed mobility needing mod A for R LE management and to sit trunk upright. Attempted sit to stand with stedy, bed height elevated and max A x1 however patient unable to clear buttock despite 3 attempts with patient minimally pulling on stedy to assist. Instruct patient in rolling to change soiled bed linens needing mod to max A and report of R knee pain/stiffness. Will continue with POC.   Follow Up Recommendations  SNF    Equipment Recommendations  None recommended by OT       Precautions / Restrictions Precautions Precautions: Fall Precaution Comments: Brain METS       Mobility Bed Mobility Overal bed mobility: Needs Assistance Bed Mobility: Rolling;Sidelying to Sit;Sit to Sidelying Rolling: Max assist Sidelying to sit: Mod assist;HOB elevated     Sit to sidelying: Max assist General bed mobility comments: needs max cues to sequence, pt distractible, needs assist R LE management due to minimal movement. limited B UE strength to push up to sitting from sidelying  Transfers                 General transfer comment: unable, attempted with stedy and max A x1    Balance Overall balance assessment: Needs assistance Sitting-balance support: Feet supported Sitting balance-Leahy Scale: Fair      Standing balance support: Bilateral upper extremity supported   Standing balance comment: unable                           ADL either performed or assessed with clinical judgement   ADL Overall ADL's : Needs assistance/impaired     Grooming: Wash/dry hands;Wash/dry face;Set up;Sitting           Upper Body Dressing : Moderate assistance;Sitting Upper Body Dressing Details (indicate cue type and reason): difficulty sequencing doffing soiled gown and assist to thread UEs into clean gown       Toilet Transfer Details (indicate cue type and reason): unable, attempted sit to stand with stedy however with max A x1 and bed height elevated unable to lift buttock from EOB Toileting- Clothing Manipulation and Hygiene: Total assistance;Bed level                         Cognition Arousal/Alertness: Awake/alert Behavior During Therapy: WFL for tasks assessed/performed Overall Cognitive Status: Impaired/Different from baseline Area of Impairment: Attention;Following commands;Safety/judgement;Memory;Problem solving                   Current Attention Level: Focused Memory: Decreased recall of precautions;Decreased short-term memory Following Commands: Follows one step commands with increased time Safety/Judgement: Decreased awareness of safety;Decreased awareness of deficits   Problem Solving: Slow processing;Requires verbal cues;Requires tactile cues General Comments: highly tangential needing cues to redirect        Exercises Exercises: General Upper Extremity General Exercises - Upper Extremity  Shoulder Flexion: AROM;AAROM;Both;10 reps;Supine Shoulder ABduction: AROM;AAROM;Both;10 reps;Supine Elbow Flexion: AROM;Both;10 reps;Supine Elbow Extension: AROM;Both;10 reps;Supine Digit Composite Flexion: AROM;Both;10 reps;Supine Composite Extension: AROM;Both;10 reps;Supine           Pertinent Vitals/ Pain       Pain Assessment: Faces Faces Pain Scale:  Hurts little more Pain Location: R knee with activity Pain Descriptors / Indicators: Aching;Grimacing Pain Intervention(s): Monitored during session         Frequency  Min 2X/week        Progress Toward Goals  OT Goals(current goals can now be found in the care plan section)  Progress towards OT goals: Not progressing toward goals - comment (tangential, distractible)  Acute Rehab OT Goals Patient Stated Goal: to go to rehab to improve strength and ability to stand OT Goal Formulation: With patient Time For Goal Achievement: 02/21/21 Potential to Achieve Goals: Fair ADL Goals Pt Will Perform Upper Body Bathing: with set-up;sitting;with supervision Pt Will Perform Upper Body Dressing: with set-up;with supervision;sitting Pt Will Transfer to Toilet: with min assist;bedside commode;stand pivot transfer Pt Will Perform Toileting - Clothing Manipulation and hygiene: with max assist;sit to/from stand Pt/caregiver will Perform Home Exercise Program: Left upper extremity;Right Upper extremity;Increased strength;Increased ROM Additional ADL Goal #1: Patient will transfer to edge of bed with mod assist in preparation for functional task.  Plan Discharge plan remains appropriate       AM-PAC OT "6 Clicks" Daily Activity     Outcome Measure   Help from another person eating meals?: A Little Help from another person taking care of personal grooming?: A Little Help from another person toileting, which includes using toliet, bedpan, or urinal?: Total Help from another person bathing (including washing, rinsing, drying)?: A Lot Help from another person to put on and taking off regular upper body clothing?: A Lot Help from another person to put on and taking off regular lower body clothing?: Total 6 Click Score: 12    End of Session  OT Visit Diagnosis: Other abnormalities of gait and mobility (R26.89);History of falling (Z91.81);Muscle weakness (generalized) (M62.81);Other symptoms and  signs involving the nervous system (R29.898);Pain Pain - Right/Left: Right Pain - part of body: Knee   Activity Tolerance Other (comment) (treatment limited secondary to cognition)   Patient Left in bed;with call bell/phone within reach;with nursing/sitter in room   Nurse Communication Mobility status        Time: 0907-1000 OT Time Calculation (min): 53 min  Charges: OT General Charges $OT Visit: 1 Visit OT Treatments $Self Care/Home Management : 53-67 mins  Delbert Phenix OT OT pager: [place] 02/10/2021, 12:50 PM

## 2021-02-10 NOTE — Progress Notes (Signed)
PROGRESS NOTE    Tammy Larsen  HEN:[id number] DOB: 1951-11-05 DOA: 02/03/2021 PCP: Pcp, No   Chief Complaint  Patient presents with  . Seizures  . Weakness  Brief Narrative: 70 year old female with osteoarthritis, no significant PMH, has home PT for right knee, brought to the ED 02/03/2021 after she had seizure-like activity during physical therapy session of her right arm and leg. Patient was found to have multiple lung metastasis, right breast mass lighting and sclerotic lesion in the left sixth rib.  MRI brain showed multiple intracranial metastatic lesion largest 3.2 cm, patient was placed on Keppra, was admitted seen by radiation oncology, hematology oncology underwent right breast biopsy on 2/11 and transferred from [facility] to facility for further treatment. 2/14-underwent whole brain radiotherapy first session  Subjective: Seen/examined this morning.  She is alert awake oriented.   She expressed that she understands what is going on , states "God will heal me " Agreed for skilled nursing facility discharge reports her brother is working on it.    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  Started on whole brain radiation 2/14 by Dr. [name].  Followed by Dr. Lorenso Courier.  Continue steroids for now continue radiation therapy-which is through 2/25.  Awaiting for biopsy result.  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  Remains on Keppra and Decadron.  Continue with whole brain irradiation.  Mild leukocytosis likely reactive/from Decadron.  Afebrile.  Monitor intermittently.  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  Goals of care: Palliative care has been consulted and following the patient remains full code.   Patient does not seem to understand the gravity of situation, and she seemed to believe that God will  cure her disease.but when asked she does indicate that she understand the diagnosis.  Palliative care following closely.  Deconditioning continue PT OT, plan for SNF.  TI:RWER symptomatic management  Hx of Chronic leg wounds/ no open wounds,  w/chronic skin changes in legs  Nutrition: Diet Order            Diet regular Room service appropriate? Yes with Assist; Fluid consistency: Thin  Diet effective now                DVT prophylaxis: enoxaparin (LOVENOX) injection 40 mg Start: 02/05/21 2200 Code Status:   Code Status: Full Code  Family Communication: plan of care discussed with patient at bedside. Self updatting her brother.  Status is: Inpatient Remains inpatient appropriate because:For newly diagnosed metastatic disease needing further management treatment  Dispo: The patient is from: Home              Anticipated d/c is to: SNF              Anticipated d/c date is: Once SNF arranged and op radiotherapy arranged, hopefully next 1 to 2 days              Patient currently is not medically stable to d/c.   Difficult to place patient No  Consultants:see note  Procedures:see note  Unresulted Labs (From admission, onward)          Start     Ordered   02/10/21 0500  Creatinine, serum  (enoxaparin (LOVENOX)    CrCl >/= 30 ml/min)  Weekly,   R     Comments: while on enoxaparin therapy    02/03/21 2202  Culture/Microbiology No results found for: SDES, SPECREQUEST, CULT, REPTSTATUS  Other culture-see note  Medications: Scheduled Meds: . dexamethasone (DECADRON) injection  4 mg Intravenous Q6H  . enoxaparin (LOVENOX) injection  40 mg Subcutaneous Q24H  . levETIRAcetam  500 mg Oral BID   Continuous Infusions:  Antimicrobials: Anti-infectives (From admission, onward)   None     Objective: Vitals: Today's Vitals   02/09/21 2204 02/09/21 2207 02/10/21 0500 02/10/21 0800  BP: (!) 154/76  (!) 156/85   Pulse: 78  60   Resp: 14  14   Temp: 98.2 F (36.8  C)  (!) 97.5 F (36.4 C)   TempSrc: Oral  Oral   SpO2: 99%  100%   Weight:   74 kg   Height:      PainSc:  0-No pain  0-No pain    Intake/Output Summary (Last 24 hours) at 02/10/2021 1130 Last data filed at 02/10/2021 0900 Gross per 24 hour  Intake 660 ml  Output 1025 ml  Net -365 ml   Filed Weights   02/07/21 0224 02/10/21 0500  Weight: 71.5 kg 74 kg   Weight change:   Intake/Output from previous day: 02/15 0701 - 02/16 0700 In: 480 [P.O.:480] Out: 1025 [Urine:1025] Intake/Output this shift: Total I/O In: 420 [P.O.:420] Out: -  Filed Weights   02/07/21 0224 02/10/21 0500  Weight: 71.5 kg 74 kg    Examination: General exam: AAOx3,NAD, weak appearing. HEENT:Oral mucosa moist, Ear/Nose WNL grossly, dentition normal. Respiratory system: bilaterally clear,no wheezing or crackles,no use of accessory muscle. Cardiovascular system: S1 & S2 +, No JVD. Gastrointestinal system: Abdomen soft, NT,ND, BS+. Nervous System:Alert, awake, moving extremities and grossly nonfocal. Extremities: Chronic skin changes on lower legs, distal peripheral pulses palpable.  Skin: No rashes,no icterus. MSK: Normal muscle bulk,tone, power.  Data Reviewed: I have personally reviewed following labs and imaging studies CBC: Recent Labs  Lab 02/03/21 2212 02/04/21 0222 02/05/21 0228 02/06/21 0920 02/09/21 0542  WBC 8.1 5.9 9.5 11.9* 11.2*  NEUTROABS  --   --   --  11.1*  --   HGB 10.0* 9.7* 8.5* 9.1* 9.8*  HCT 32.6* 31.4* 27.7* 30.0* 30.7*  MCV 70.7* 70.2* 71.2* 71.4* 69.6*  PLT 324 304 296 295 195   Basic Metabolic Panel: Recent Labs  Lab 02/03/21 1204 02/03/21 2212 02/04/21 0222 02/06/21 0920 02/09/21 0542 02/10/21 0547  NA 140  --  137 141 139  --   K 4.1  --  4.1 3.6 4.0  --   CL 101  --  101 109 106  --   CO2 26  --  23 21* 23  --   GLUCOSE 105*  --  161* 176* 120*  --   BUN 13  --  11 18 27*  --   CREATININE 0.76 0.69 0.66 0.77 0.64 0.48  CALCIUM 9.5  --  9.1 8.7* 8.1*   --   MG  --   --   --  1.8  --   --    GFR: Estimated Creatinine Clearance: 66.6 mL/min (by C-G formula based on SCr of 0.48 mg/dL). Liver Function Tests: Recent Labs  Lab 02/04/21 0222  AST 18  ALT 16  ALKPHOS 60  BILITOT 0.6  PROT 6.7  ALBUMIN 3.0*   No results for input(s): LIPASE, AMYLASE in the last 168 hours. No results for input(s): AMMONIA in the last 168 hours. Coagulation Profile: No results for input(s): INR, PROTIME in the last 168 hours. Cardiac Enzymes: No  results for input(s): CKTOTAL, CKMB, CKMBINDEX, TROPONINI in the last 168 hours. BNP (last 3 results) No results for input(s): PROBNP in the last 8760 hours. HbA1C: No results for input(s): HGBA1C in the last 72 hours. CBG: Recent Labs  Lab 02/04/21 2036  GLUCAP 159*   Lipid Profile: No results for input(s): CHOL, HDL, LDLCALC, TRIG, CHOLHDL, LDLDIRECT in the last 72 hours. Thyroid Function Tests: No results for input(s): TSH, T4TOTAL, FREET4, T3FREE, THYROIDAB in the last 72 hours. Anemia Panel: No results for input(s): VITAMINB12, FOLATE, FERRITIN, TIBC, IRON, RETICCTPCT in the last 72 hours. Sepsis Labs: No results for input(s): PROCALCITON, LATICACIDVEN in the last 168 hours.  Recent Results (from the past 240 hour(s))  SARS CORONAVIRUS 2 (TAT 6-24 HRS) Nasopharyngeal Nasopharyngeal Swab     Status: None   Collection Time: 02/03/21 10:12 PM   Specimen: Nasopharyngeal Swab  Result Value Ref Range Status   SARS Coronavirus 2 NEGATIVE NEGATIVE Final    Comment: (NOTE) SARS-CoV-2 target nucleic acids are NOT DETECTED.  The SARS-CoV-2 RNA is generally detectable in upper and lower respiratory specimens during the acute phase of infection. Negative results do not preclude SARS-CoV-2 infection, do not rule out co-infections with other pathogens, and should not be used as the sole basis for treatment or other patient management decisions. Negative results must be combined with clinical  observations, patient history, and epidemiological information. The expected result is Negative.  Fact Sheet for Patients: SugarRoll.be  Fact Sheet for Healthcare Providers: https://www.woods-mathews.com/  This test is not yet approved or cleared by the [place] FDA and  has been authorized for detection and/or diagnosis of SARS-CoV-2 by FDA under an Emergency Use Authorization (EUA). This EUA will remain  in effect (meaning this test can be used) for the duration of the COVID-19 declaration under Se ction 564(b)(1) of the Act, 21 U.S.C. section 360bbb-3(b)(1), unless the authorization is terminated or revoked sooner.  Performed at [facility], [place] [address] [place], [place] [address]      Radiology Studies: No results found.   LOS: 7 days   Antonieta Pert, MD [facility]  02/10/2021, 11:30 AM

## 2021-02-10 NOTE — NC FL2 (Signed)
  [place] LEVEL OF CARE SCREENING TOOL     IDENTIFICATION  Patient Name: Tammy Larsen Birthdate: 1951-09-14 Sex: female Admission Date (Current Location): 02/03/2021  [facility] and [place] Number:  Herbalist and Address:  [facility],  [place] [address] [place]      Provider Number: [id number]  Attending Physician Name and Address:  Antonieta Pert, MD  Relative Name and Phone Number:       Current Level of Care: Hospital Recommended Level of Care: [place] Prior Approval Number:    Date Approved/Denied:   PASRR Number: [id number]  Discharge Plan: SNF    Current Diagnoses: Patient Active Problem List   Diagnosis Date Noted  . Metastatic malignant neoplasm ([place])   . Palliative care by specialist   . Brain metastases ([place]) 02/03/2021  . Seizure ([place]) 02/03/2021  . Osteoarthritis 02/03/2021    Orientation RESPIRATION BLADDER Height & Weight     Self,Time,Situation,Place  Normal Incontinent Weight: 74 kg Height:  5' 5.5" (166.4 cm)  BEHAVIORAL SYMPTOMS/MOOD NEUROLOGICAL BOWEL NUTRITION STATUS      Incontinent Diet (Regular)  AMBULATORY STATUS COMMUNICATION OF NEEDS Skin   Extensive Assist Verbally Normal                       Personal Care Assistance Level of Assistance  Bathing,Dressing Bathing Assistance: Maximum assistance   Dressing Assistance: Maximum assistance     Functional Limitations Info             SPECIAL CARE FACTORS FREQUENCY  PT (By licensed PT),OT (By licensed OT)     PT Frequency: 5 x weekly OT Frequency: 5 x weekly            Contractures Contractures Info: Not present    Additional Factors Info  Code Status,Allergies Code Status Info: Full Allergies Info: Penicillins           Current Medications (02/10/2021):  This is the current hospital active medication list Current Facility-Administered Medications  Medication Dose Route Frequency Provider Last Rate  Last Admin  . acetaminophen (TYLENOL) tablet 650 mg  650 mg Oral Q6H PRN Hosie Poisson, MD      . dexamethasone (DECADRON) injection 4 mg  4 mg Intravenous Q6H Hosie Poisson, MD   4 mg at 02/10/21 1015  . enoxaparin (LOVENOX) injection 40 mg  40 mg Subcutaneous Q24H Hosie Poisson, MD   40 mg at 02/09/21 2207  . levETIRAcetam (KEPPRA) tablet 500 mg  500 mg Oral BID Hosie Poisson, MD   500 mg at 02/10/21 1015  . lip balm (CARMEX) ointment 1 application  1 application Topical PRN Kc, Ramesh, MD      . ondansetron (ZOFRAN) tablet 4 mg  4 mg Oral Q6H PRN Hosie Poisson, MD       Or  . ondansetron (ZOFRAN) injection 4 mg  4 mg Intravenous Q6H PRN Hosie Poisson, MD      . polyethylene glycol (MIRALAX / GLYCOLAX) packet 17 g  17 g Oral Daily PRN Opyd, Ilene Qua, MD      . senna (SENOKOT) tablet 8.6 mg  1 tablet Oral QHS PRN Opyd, Ilene Qua, MD   8.6 mg at 02/06/21 6301     Discharge Medications: Please see discharge summary for a list of discharge medications.  Relevant Imaging Results:  Relevant Lab Results:   Additional Information ss# [id number]  Camree Wigington, Marjie Skiff, RN

## 2021-02-10 NOTE — TOC Initial Note (Signed)
Transition of Care [facility]) - Initial/Assessment Note    Patient Details  Name: Tammy Larsen MRN: [id number] Date of Birth: 22-Jun-1951  [facility]) CM/SW Contact:    Brack Shaddock, Marjie Skiff, RN Phone Number: 02/10/2021, 3:42 PM  Clinical Narrative:                 Spoke with pt at bedside who defers all decision making about dc planning to her [name]. Spoke with Johnny by phone who states that pt needs to go to SNF for rehab. FL2 faxed out to area facilities. Pt is scheduled for palliative radiation M-F until 2/25. This will be a barrier for SNF placement as many SNF's cannot provide transportation daily. Auth started with [facility] for SNF.  Expected Discharge Plan: Skilled Nursing Facility Barriers to Discharge: Continued Medical Work up   Expected Discharge Plan and Services Expected Discharge Plan: [place]   Discharge Planning Services: CM Consult   Living arrangements for the past 2 months: Single Family Home                   Prior Living Arrangements/Services Living arrangements for the past 2 months: Single Family Home Lives with:: Siblings Patient language and need for interpreter reviewed:: Yes        Need for Family Participation in Patient Care: Yes (Comment) Care giver support system in place?: Yes (comment)   Criminal Activity/Legal Involvement Pertinent to Current Situation/Hospitalization: No - Comment as needed  Activities of Daily Living Home Assistive Devices/Equipment: Cane (specify quad or straight),Blood pressure cuff,Dentures (specify type),Shower chair with back,Wheelchair,Walker (specify type) ADL Screening (condition at time of admission) Patient's cognitive ability adequate to safely complete daily activities?: Yes Is the patient deaf or have difficulty hearing?: No Does the patient have difficulty seeing, even when wearing glasses/contacts?: No Does the patient have difficulty concentrating, remembering, or making decisions?:  Yes (appears somewhat forgetful at times) Patient able to express need for assistance with ADLs?: Yes Does the patient have difficulty dressing or bathing?: Yes Independently performs ADLs?: No Communication: Independent Dressing (OT): Dependent Is this a change from baseline?: Change from baseline, expected to last >3 days Grooming: Dependent Is this a change from baseline?: Change from baseline, expected to last >3 days Feeding: Independent Bathing: Dependent Is this a change from baseline?: Change from baseline, expected to last >3 days Toileting: Dependent Is this a change from baseline?: Change from baseline, expected to last >3days In/Out Bed: Dependent Is this a change from baseline?: Change from baseline, expected to last >3 days Walks in Home: Dependent Is this a change from baseline?: Change from baseline, expected to last >3 days Does the patient have difficulty walking or climbing stairs?: Yes Weakness of Legs: Both Weakness of Arms/Hands: None  Permission Sought/Granted                  Emotional Assessment Appearance:: Appears stated age Attitude/Demeanor/Rapport: Gracious Affect (typically observed): Calm Orientation: : Oriented to Self,Oriented to Place,Oriented to  Time,Oriented to Situation Alcohol / Substance Use: Not Applicable Psych Involvement: No (comment)  Admission diagnosis:  Partial seizure ([place]) [R56.9] Metastatic cancer to brain ([place]) [C79.31] Frontal mass of brain [G93.89] Metastatic malignant neoplasm, unspecified site [facility]) [C79.9] Patient Active Problem List   Diagnosis Date Noted  . Metastatic malignant neoplasm ([place])   . Palliative care by specialist   . Brain metastases ([place]) 02/03/2021  . Seizure ([place]) 02/03/2021  . Osteoarthritis 02/03/2021   PCP:  Pcp, No Pharmacy:  [id number] [place], [place] [address] [place] [address] Phone:  [phone] Fax: [phone]     Social Determinants of Health (SDOH) Interventions    Readmission Risk Interventions No flowsheet data found.

## 2021-02-11 ENCOUNTER — Ambulatory Visit
Admit: 2021-02-11 | Discharge: 2021-02-11 | Disposition: A | Discharge status: Home / Self Care | Payer: PPO | Attending: Radiation Oncology | Admitting: Radiation Oncology

## 2021-02-11 DIAGNOSIS — C7931 Secondary malignant neoplasm of brain: Secondary | ICD-10-CM | POA: Diagnosis not present

## 2021-02-11 DIAGNOSIS — Z7189 Other specified counseling: Secondary | ICD-10-CM

## 2021-02-11 DIAGNOSIS — Z515 Encounter for palliative care: Secondary | ICD-10-CM | POA: Diagnosis not present

## 2021-02-11 NOTE — Care Management Important Message (Signed)
Important Message  Patient Details IM Letter given to the Patient. Name: Tammy Larsen MRN: [id number] Date of Birth: 1951/02/12   Medicare Important Message Given:  Yes     [name] 02/11/2021, 12:53 PM

## 2021-02-11 NOTE — Progress Notes (Signed)
PROGRESS NOTE    Tammy Larsen  YDX:[id number] DOB: 1951/02/21 DOA: 02/03/2021 PCP: Pcp, No   Chief Complaint  Patient presents with  . Seizures  . Weakness  Brief Narrative: 70 year old female with osteoarthritis, no significant PMH, has home PT for right knee, brought to the ED 02/03/2021 after she had seizure-like activity during physical therapy session of her right arm and leg. Patient was found to have multiple lung metastasis, right breast mass lighting and sclerotic lesion in the left sixth rib.  MRI brain showed multiple intracranial metastatic lesion largest 3.2 cm, patient was placed on Keppra, was admitted seen by radiation oncology, hematology oncology underwent right breast biopsy on 2/11 and transferred from [facility] to facility for further treatment. 2/14-underwent whole brain radiotherapy first session AND is being done on daily basis  Subjective: Seen and examined this morning.  No new complaints.  Tolerating whole brain irradiation.   Biopsy report came back and patient is aware about the diagnosis.    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  Started on whole brain radiation 2/14 by Dr. [name] and is being continued daily, plan is to continue through 2/25.. Followed by Dr. Lorenso Courier, palliative care.   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  Continue Keppra and Decadron.  Continue with whole brain irradiation.    Mild leukocytosis likely reactive/from Decadron.  Afebrile.  Anemia likely in the setting of malignant disease overall stable.  Goals of care: Palliative care has been consulted and following the patient remains full code.   Patient does not seem to understand the gravity of situation, and she seemed to  believe that God will cure her disease.but when asked she does indicate that she understand the diagnosis, and I have explained her again this morning with positive biopsy result. Palliative care following closely.  Deconditioning planning for skilled nursing facility  HM:CNOB symptomatic management  Hx of Chronic leg wounds/ no open wounds,  w/chronic skin changes in legs  Nutrition: Diet Order            Diet regular Room service appropriate? Yes with Assist; Fluid consistency: Thin  Diet effective now                DVT prophylaxis: enoxaparin (LOVENOX) injection 40 mg Start: 02/05/21 2200 Code Status:   Code Status: Full Code  Family Communication: plan of care discussed with patient at bedside. Self updatting her brother. I called to update her brother.    Status is: Inpatient Remains inpatient appropriate because:For newly diagnosed metastatic disease needing further management treatment  Dispo: The patient is from: Home              Anticipated d/c is to: SNF              Anticipated d/c date is: Tomorrow if okay with hematology oncology and radiation team.                Patient currently is not medically stable to d/c.   Difficult to place patient No  Consultants:see note  Procedures:see note  Unresulted Labs (From admission, onward)          Start     Ordered   02/10/21 0500  Creatinine, serum  (enoxaparin (LOVENOX)    CrCl >/=  30 ml/min)  Weekly,   R     Comments: while on enoxaparin therapy    02/03/21 2202          Culture/Microbiology No results found for: SDES, SPECREQUEST, CULT, REPTSTATUS  Other culture-see note  Medications: Scheduled Meds: . dexamethasone (DECADRON) injection  4 mg Intravenous Q6H  . enoxaparin (LOVENOX) injection  40 mg Subcutaneous Q24H  . levETIRAcetam  500 mg Oral BID   Continuous Infusions:  Antimicrobials: Anti-infectives (From admission, onward)   None     Objective: Vitals: Today's Vitals   02/10/21 2115  02/11/21 0456 02/11/21 0501 02/11/21 0908  BP:  (!) 160/85 (!) 143/81 (!) 144/91  Pulse:  60 (!) 58 76  Resp:   14 16  Temp:  (!) 97.5 F (36.4 C) 97.9 F (36.6 C) 98.6 F (37 C)  TempSrc:  Oral Oral Oral  SpO2:  100% 99% 99%  Weight:      Height:      PainSc: 0-No pain   0-No pain    Intake/Output Summary (Last 24 hours) at 02/11/2021 1203 Last data filed at 02/11/2021 0504 Gross per 24 hour  Intake 220 ml  Output 1150 ml  Net -930 ml   Filed Weights   02/07/21 0224 02/10/21 0500  Weight: 71.5 kg 74 kg   Weight change:   Intake/Output from previous day: 02/16 0701 - 02/17 0700 In: 640 [P.O.:640] Out: 1150 [Urine:1150] Intake/Output this shift: No intake/output data recorded. Filed Weights   02/07/21 0224 02/10/21 0500  Weight: 71.5 kg 74 kg    Examination: General exam: AAOx3 , NAD, weak appearing. HEENT:Oral mucosa moist, Ear/Nose WNL grossly, dentition normal. Respiratory system: bilaterally clear,no wheezing or crackles,no use of accessory muscle Cardiovascular system: S1 & S2 +, No JVD,. Gastrointestinal system: Abdomen soft, NT,ND, BS+ Nervous System:Alert, awake, moving extremities and grossly nonfocal Extremities: No edema, distal peripheral pulses palpable.  Skin: Chronic skin changes in the lower extremities no rashes,no icterus. MSK: Normal muscle bulk,tone, power  Data Reviewed: I have personally reviewed following labs and imaging studies CBC: Recent Labs  Lab 02/05/21 0228 02/06/21 0920 02/09/21 0542  WBC 9.5 11.9* 11.2*  NEUTROABS  --  11.1*  --   HGB 8.5* 9.1* 9.8*  HCT 27.7* 30.0* 30.7*  MCV 71.2* 71.4* 69.6*  PLT 296 295 761   Basic Metabolic Panel: Recent Labs  Lab 02/06/21 0920 02/09/21 0542 02/10/21 0547  NA 141 139  --   K 3.6 4.0  --   CL 109 106  --   CO2 21* 23  --   GLUCOSE 176* 120*  --   BUN 18 27*  --   CREATININE 0.77 0.64 0.48  CALCIUM 8.7* 8.1*  --   MG 1.8  --   --    GFR: Estimated Creatinine Clearance:  66.6 mL/min (by C-G formula based on SCr of 0.48 mg/dL). Liver Function Tests: No results for input(s): AST, ALT, ALKPHOS, BILITOT, PROT, ALBUMIN in the last 168 hours. No results for input(s): LIPASE, AMYLASE in the last 168 hours. No results for input(s): AMMONIA in the last 168 hours. Coagulation Profile: No results for input(s): INR, PROTIME in the last 168 hours. Cardiac Enzymes: No results for input(s): CKTOTAL, CKMB, CKMBINDEX, TROPONINI in the last 168 hours. BNP (last 3 results) No results for input(s): PROBNP in the last 8760 hours. HbA1C: No results for input(s): HGBA1C in the last 72 hours. CBG: Recent Labs  Lab 02/04/21 2036  GLUCAP 159*  Lipid Profile: No results for input(s): CHOL, HDL, LDLCALC, TRIG, CHOLHDL, LDLDIRECT in the last 72 hours. Thyroid Function Tests: No results for input(s): TSH, T4TOTAL, FREET4, T3FREE, THYROIDAB in the last 72 hours. Anemia Panel: No results for input(s): VITAMINB12, FOLATE, FERRITIN, TIBC, IRON, RETICCTPCT in the last 72 hours. Sepsis Labs: No results for input(s): PROCALCITON, LATICACIDVEN in the last 168 hours.  Recent Results (from the past 240 hour(s))  SARS CORONAVIRUS 2 (TAT 6-24 HRS) Nasopharyngeal Nasopharyngeal Swab     Status: None   Collection Time: 02/03/21 10:12 PM   Specimen: Nasopharyngeal Swab  Result Value Ref Range Status   SARS Coronavirus 2 NEGATIVE NEGATIVE Final    Comment: (NOTE) SARS-CoV-2 target nucleic acids are NOT DETECTED.  The SARS-CoV-2 RNA is generally detectable in upper and lower respiratory specimens during the acute phase of infection. Negative results do not preclude SARS-CoV-2 infection, do not rule out co-infections with other pathogens, and should not be used as the sole basis for treatment or other patient management decisions. Negative results must be combined with clinical observations, patient history, and epidemiological information. The expected result is Negative.  Fact  Sheet for Patients: SugarRoll.be  Fact Sheet for Healthcare Providers: https://www.woods-mathews.com/  This test is not yet approved or cleared by the [place] FDA and  has been authorized for detection and/or diagnosis of SARS-CoV-2 by FDA under an Emergency Use Authorization (EUA). This EUA will remain  in effect (meaning this test can be used) for the duration of the COVID-19 declaration under Se ction 564(b)(1) of the Act, 21 U.S.C. section 360bbb-3(b)(1), unless the authorization is terminated or revoked sooner.  Performed at [facility], [place] [address], [place], [place] [address]      Radiology Studies: No results found.   LOS: 8 days   Antonieta Pert, MD [facility]  02/11/2021, 12:03 PM

## 2021-02-11 NOTE — Progress Notes (Signed)
Daily Progress Note   Patient Name: Tammy Larsen       Date: 02/11/2021 DOB: 1951/10/04  Age: 70 y.o. MRN#: [id number] Attending Physician: Antonieta Pert, MD Primary Care Physician: Pcp, No Admit Date: 02/03/2021  Reason for Consultation/Follow-up: Establishing goals of care  Subjective:  resting in bed. No distress.    Length of Stay: 8  Current Medications: Scheduled Meds:  . dexamethasone (DECADRON) injection  4 mg Intravenous Q6H  . enoxaparin (LOVENOX) injection  40 mg Subcutaneous Q24H  . levETIRAcetam  500 mg Oral BID    Continuous Infusions:   PRN Meds: acetaminophen, lip balm, ondansetron **OR** ondansetron (ZOFRAN) IV, polyethylene glycol, senna  Physical Exam         Awake alert resting in bed Regular work of breathing S 1 S 2  Abdomen not tender Has dry skin No distress   Vital Signs: BP (!) 144/91 (BP Location: Right Arm)   Pulse 76   Temp 98.6 F (37 C) (Oral)   Resp 16   Ht 5' 5.5" (1.664 m)   Wt 74 kg   SpO2 99%   BMI 26.74 kg/m  SpO2: SpO2: 99 % O2 Device: O2 Device: Room Air O2 Flow Rate:    Intake/output summary:   Intake/Output Summary (Last 24 hours) at 02/11/2021 1314 Last data filed at 02/11/2021 0504 Gross per 24 hour  Intake --  Output 1150 ml  Net -1150 ml   LBM: Last BM Date: 02/10/21 (Patient reported) Baseline Weight: Weight: 71.5 kg Most recent weight: Weight: 74 kg       Palliative Assessment/Data:    Flowsheet Rows   Flowsheet Row Most Recent Value  Intake Tab   Referral Department Hospitalist  Unit at Time of Referral Oncology Unit  Palliative Care Primary Diagnosis Cancer  Date Notified 02/04/21  Palliative Care Type New Palliative care  Reason for referral Clarify Goals of Care  Date of Admission 02/03/21  Date  first seen by Palliative Care 02/08/21  # of days Palliative referral response time 4 Day(s)  # of days IP prior to Palliative referral 1  Clinical Assessment   Palliative Performance Scale Score 40%  Psychosocial & Spiritual Assessment   Palliative Care Outcomes   Patient/Family meeting held? Yes  Who was at the meeting? Patient  Palliative Care Outcomes Clarified goals  of care      Patient Active Problem List   Diagnosis Date Noted  . Metastatic malignant neoplasm ([place])   . Palliative care by specialist   . Brain metastases ([place]) 02/03/2021  . Seizure ([place]) 02/03/2021  . Osteoarthritis 02/03/2021    Palliative Care Assessment & Plan   Patient Profile:  70 year old female with osteoarthritis, no significant PMH, has home PT for right knee, brought to the ED 02/03/2021 after she had seizure-like activity during physical therapy session of her right arm and leg. Patient was found to have multiple lung metastasis, right breast mass lighting and sclerotic lesion in the left sixth rib.  MRI brain showed multiple intracranial metastatic lesion largest 3.2 cm, patient was placed on Keppra, was admitted seen by radiation oncology, hematology oncology underwent right breast biopsy on 2/11 and transferred from [facility] to facility for further treatment. 2/14-underwent whole brain radiotherapy first session. Patient also had radiotherapy today.   Assessment:  70 year old with life limiting illness of probable metastatic breast cancer with mets to brain lung and bones.   Recommendations/Plan:   patient endorses full code, full scope care.   Ongoing multi discilipinary discussions regarding patient's current condition and prognosis.   Discussed with patient today regarding her current diagnosis, reviewed differences between full code and DNR. Discussed that DNR does not mean do not treat. Patient will discuss further with her brother.    Code Status:    Code Status Orders  (From  admission, onward)         Start     Ordered   02/03/21 2202  Full code  Continuous        02/03/21 2202        Code Status History    This patient has a current code status but no historical code status.   Advance Care Planning Activity       Prognosis:   Unable to determine  Discharge Planning:  To Be Determined Recommend SNF rehab with palliative on discharge.   Care plan was discussed with patient.   Thank you for allowing the Palliative Medicine Team to assist in the care of this patient.   Time In: 1300 Time Out: 13.25 Total Time  25 Prolonged Time Billed  no       Greater than 50%  of this time was spent counseling and coordinating care related to the above assessment and plan.  [name], MD  Please contact Palliative Medicine Team phone at [phone] for questions and concerns.

## 2021-02-11 NOTE — TOC Progression Note (Signed)
Transition of Care [facility]) - Progression Note    Patient Details  Name: Tammy Larsen MRN: [id number] Date of Birth: [birth year]  [facility]) CM/SW Contact  Amiya Escamilla, Marjie Skiff, RN Phone Number: 02/11/2021, 3:49 PM  Clinical Narrative:    Per HTA liaison pt was denied for short term rehab. Peer to peer offered with Dr. Amalia Hailey [phone]. Attending alerted of offer for peer to peer. Spoke with brother via phone to give him the information as well. Pt also does not have any bed offers for SNF at this time. TOC will continue to follow.   Expected Discharge Plan: [place] Barriers to Discharge: Continued Medical Work up  Expected Discharge Plan and Services Expected Discharge Plan: [place]   Discharge Planning Services: CM Consult   Living arrangements for the past 2 months: Single Family Home                    Social Determinants of Health (SDOH) Interventions    Readmission Risk Interventions No flowsheet data found.

## 2021-02-12 ENCOUNTER — Ambulatory Visit
Admit: 2021-02-12 | Discharge: 2021-02-12 | Disposition: A | Discharge status: Home / Self Care | Payer: PPO | Attending: Radiation Oncology | Admitting: Radiation Oncology

## 2021-02-12 DIAGNOSIS — C7931 Secondary malignant neoplasm of brain: Secondary | ICD-10-CM | POA: Diagnosis not present

## 2021-02-12 LAB — SURGICAL PATHOLOGY

## 2021-02-12 NOTE — Progress Notes (Signed)
PT Cancellation Note  Patient Details Name: Tammy Larsen MRN: [id number] DOB: 02-24-1951   Cancelled Treatment:    Reason Eval/Treat Not Completed: Patient at procedure or test/unavailable. Pt leaving with radiology transport team. Will check back as schedule permits.    Tori Quisha Mabie PT, DPT 02/12/21, 10:23 AM

## 2021-02-12 NOTE — Progress Notes (Signed)
PROGRESS NOTE    Tammy Larsen  QQP:[id number] DOB: Feb 12, 1951 DOA: 02/03/2021 PCP: Pcp, No   Chief Complaint  Patient presents with   Seizures   Weakness  Brief Narrative: 70 year old female with osteoarthritis, no significant PMH, has home PT for right knee, brought to the ED 02/03/2021 after she had seizure-like activity during physical therapy session of her right arm and leg. Patient was found to have multiple lung metastasis, right breast mass lighting and sclerotic lesion in the left sixth rib.  MRI brain showed multiple intracranial metastatic lesion largest 3.2 cm, patient was placed on Keppra, was admitted seen by radiation oncology, hematology oncology underwent right breast biopsy on 2/11 and transferred from [facility] to facility for further treatment. 2/14-underwent whole brain radiotherapy first session AND is being done on daily basis  Subjective: Seen this am on her way to XRT Brother next to her  Assessment & Plan:  Metastatic breast cancer with mets to brain breast and lung, bones: Underwent breast biopsy 2/11-biopsy result is positive, notified oncology patient would like to explore if she has any option for chemotherapy or any other treatment.  Started on whole brain radiation 2/14 by Dr. [name] and is being continued daily, plan is to continue through 2/25 and OP oncology f/u to see if she is candidate for oral chemo.Followed by Dr. Lorenso Courier, palliative care.   Seizures episode 2/2 widespread brain mets and edema: no recurrence of seizure. continue Keppra and Decadron as per radi-onc and whole brain irradiations.    Mild leukocytosis likely reactive/from Decadron.  Afebrile.  Anemia likely in the setting of malignant disease overall stable.  Goals of care: Palliative care has been consulted and following the patient remains full code.   Patient does not seem to understand the gravity of situation, and she seemed to believe that God will cure her disease.but when  asked she does indicate that she understand the diagnosis, and I have explained her again this morning with positive biopsy result. Palliative care following closely.  Deconditioning planning for skilled nursing facility  ZT:IWPY symptomatic management  Hx of Chronic leg wounds/ no open wounds,  w/chronic skin changes in legs  Nutrition: Diet Order             Diet regular Room service appropriate? Yes with Assist; Fluid consistency: Thin  Diet effective now                  DVT prophylaxis: enoxaparin (LOVENOX) injection 40 mg Start: 02/05/21 2200 Code Status:   Code Status: Full Code  Family Communication: plan of care discussed with patient at bedside. Self updatting her brother. I called to update her brother.    Status is: Inpatient Remains inpatient appropriate because: For newly diagnosed metastatic disease needing further management treatment  Dispo: The patient is from: Home              Anticipated d/c is to: SNF              Anticipated d/c date is: Once SNF approved.  I did peer to peer review              Patient currently is not medically stable to d/c.   Difficult to place patient No  Consultants:see note  Procedures:see note  Unresulted Labs (From admission, onward)            Start     Ordered   02/10/21 0500  Creatinine, serum  (enoxaparin (LOVENOX)    CrCl >/=  30 ml/min)  Weekly,   R     Comments: while on enoxaparin therapy    02/03/21 2202            Culture/Microbiology No results found for: SDES, SPECREQUEST, CULT, REPTSTATUS  Other culture-see note  Medications: Scheduled Meds:  dexamethasone (DECADRON) injection  4 mg Intravenous Q6H   enoxaparin (LOVENOX) injection  40 mg Subcutaneous Q24H   levETIRAcetam  500 mg Oral BID   Continuous Infusions:   Antimicrobials: Anti-infectives (From admission, onward)    None      Objective: Vitals: Today's Vitals   02/11/21 0908 02/11/21 2200 02/11/21 2211 02/12/21 0454  BP: (!)  144/91  136/78 138/76  Pulse: 76  (!) 59 (!) 56  Resp: 16  14 14   Temp: 98.6 F (37 C)  98.5 F (36.9 C) 97.6 F (36.4 C)  TempSrc: Oral  Oral Oral  SpO2: 99%  96% 97%  Weight:      Height:      PainSc: 0-No pain 0-No pain      Intake/Output Summary (Last 24 hours) at 02/12/2021 1425 Last data filed at 02/11/2021 2213 Gross per 24 hour  Intake --  Output 300 ml  Net -300 ml   Filed Weights   02/07/21 0224 02/10/21 0500  Weight: 71.5 kg 74 kg   Weight change:   Intake/Output from previous day: 02/17 0701 - 02/18 0700 In: -  Out: 300 [Urine:300] Intake/Output this shift: No intake/output data recorded. Filed Weights   02/07/21 0224 02/10/21 0500  Weight: 71.5 kg 74 kg    Examination: General exam: AAO,NAD, weak appearing. HEENT:Oral mucosa moist, Ear/Nose WNL grossly, dentition normal. Respiratory system: bilaterally clear,no wheezing or crackles,no use of accessory muscle Cardiovascular system: S1 & S2 +, No JVD,. Gastrointestinal system: Abdomen soft, NT,ND, BS+ Nervous System:Alert, awake, moving extremities and grossly nonfocal Extremities: No edema, chronic hyperpigmented skin changes, distal peripheral pulses palpable.  Skin: No rashes,no icterus. MSK: Normal muscle bulk,tone, power   Data Reviewed: I have personally reviewed following labs and imaging studies CBC: Recent Labs  Lab 02/06/21 0920 02/09/21 0542  WBC 11.9* 11.2*  NEUTROABS 11.1*  --   HGB 9.1* 9.8*  HCT 30.0* 30.7*  MCV 71.4* 69.6*  PLT 295 335   Basic Metabolic Panel: Recent Labs  Lab 02/06/21 0920 02/09/21 0542 02/10/21 0547  NA 141 139  --   K 3.6 4.0  --   CL 109 106  --   CO2 21* 23  --   GLUCOSE 176* 120*  --   BUN 18 27*  --   CREATININE 0.77 0.64 0.48  CALCIUM 8.7* 8.1*  --   MG 1.8  --   --    GFR: Estimated Creatinine Clearance: 66.6 mL/min (by C-G formula based on SCr of 0.48 mg/dL). Liver Function Tests: No results for input(s): AST, ALT, ALKPHOS, BILITOT,  PROT, ALBUMIN in the last 168 hours. No results for input(s): LIPASE, AMYLASE in the last 168 hours. No results for input(s): AMMONIA in the last 168 hours. Coagulation Profile: No results for input(s): INR, PROTIME in the last 168 hours. Cardiac Enzymes: No results for input(s): CKTOTAL, CKMB, CKMBINDEX, TROPONINI in the last 168 hours. BNP (last 3 results) No results for input(s): PROBNP in the last 8760 hours. HbA1C: No results for input(s): HGBA1C in the last 72 hours. CBG: No results for input(s): GLUCAP in the last 168 hours. Lipid Profile: No results for input(s): CHOL, HDL, LDLCALC, TRIG, CHOLHDL, LDLDIRECT in  the last 72 hours. Thyroid Function Tests: No results for input(s): TSH, T4TOTAL, FREET4, T3FREE, THYROIDAB in the last 72 hours. Anemia Panel: No results for input(s): VITAMINB12, FOLATE, FERRITIN, TIBC, IRON, RETICCTPCT in the last 72 hours. Sepsis Labs: No results for input(s): PROCALCITON, LATICACIDVEN in the last 168 hours.  Recent Results (from the past 240 hour(s))  SARS CORONAVIRUS 2 (TAT 6-24 HRS) Nasopharyngeal Nasopharyngeal Swab     Status: None   Collection Time: 02/03/21 10:12 PM   Specimen: Nasopharyngeal Swab  Result Value Ref Range Status   SARS Coronavirus 2 NEGATIVE NEGATIVE Final    Comment: (NOTE) SARS-CoV-2 target nucleic acids are NOT DETECTED.  The SARS-CoV-2 RNA is generally detectable in upper and lower respiratory specimens during the acute phase of infection. Negative results do not preclude SARS-CoV-2 infection, do not rule out co-infections with other pathogens, and should not be used as the sole basis for treatment or other patient management decisions. Negative results must be combined with clinical observations, patient history, and epidemiological information. The expected result is Negative.  Fact Sheet for Patients: SugarRoll.be  Fact Sheet for Healthcare  Providers: https://www.woods-mathews.com/  This test is not yet approved or cleared by the [place] FDA and  has been authorized for detection and/or diagnosis of SARS-CoV-2 by FDA under an Emergency Use Authorization (EUA). This EUA will remain  in effect (meaning this test can be used) for the duration of the COVID-19 declaration under Se ction 564(b)(1) of the Act, 21 U.S.C. section 360bbb-3(b)(1), unless the authorization is terminated or revoked sooner.  Performed at [facility], [place] [address] [place], [place] [address]      Radiology Studies: No results found.   LOS: 9 days   Antonieta Pert, MD [facility]  02/12/2021, 2:25 PM

## 2021-02-12 NOTE — TOC Progression Note (Addendum)
Transition of Care [facility]) - Progression Note    Patient Details  Name: Leinani [place] MRN: [id number] Date of Birth: 1950-12-28  Transition [facility] Care [facility]) CM/SW Contact  Delaila Nand, Marjie Skiff, RN Phone Number: 02/12/2021, 4:10 PM  Clinical Narrative:    Was informed by HTA that Peer to Peer for SNF was denied. Denial letter given to pt. Will call brother and let him know as well.  Addendum:  Spoke with brother at length for dc planning. Brother plans to speak with pt over the weekend about her financial situation to see if she can pay privately for long term care or if she can pay for care in his home while he works. He also will speak with her about applying for Medicaid. She will need to be transported to and from radiation M-F next week. Pt is not physically able to be transported by private vehicle at this time.   Expected Discharge Plan: [place] Barriers to Discharge: Continued Medical Work up  Expected Discharge Plan and Services Expected Discharge Plan: [place]   Discharge Planning Services: CM Consult   Living arrangements for the past 2 months: Single Family Home                 Social Determinants of Health (SDOH) Interventions    Readmission Risk Interventions No flowsheet data found.

## 2021-02-12 NOTE — TOC Progression Note (Signed)
Transition of Care [facility]) - Progression Note    Patient Details  Name: Tammy Larsen MRN: [id number] Date of Birth: [birth year]  Transition of Care [facility]) CM/SW Contact  Anjalee Cope, Marjie Skiff, RN Phone Number: 02/12/2021, 2:41 PM  Clinical Narrative:    HTA liaison asking for a new PT note to complete Peer to Peer. Contacted PT leader to make sure PT sees her today.   HTA approved ambulance transportation [phone]   Expected Discharge Plan: [place] Barriers to Discharge: Continued Medical Work up  Expected Discharge Plan and Services Expected Discharge Plan: [place]   Discharge Planning Services: CM Consult   Living arrangements for the past 2 months: Single Family Home                                       Social Determinants of Health (SDOH) Interventions    Readmission Risk Interventions No flowsheet data found.

## 2021-02-12 NOTE — Progress Notes (Signed)
Physical Therapy Treatment Patient Details Name: Tammy Larsen MRN: [id number] DOB: 15-Aug-1951 Today's Date: 02/12/2021    History of Present Illness 70 y.o. female with medical history significant of osteoarthritis otherwise no significant past medical history. Pt experienced seizure-like activity on 02/03/2021 when working with [place]. CT abdomen and pelvis demonstrate multiple lung mets and R breast mass. Head CT demonstrates bilateral frontal lobe masses. Pt underwent R breast mass biopsy on 02/05/2021. Has been transferred to [facility] for radiation.    PT Comments    Pt able to come to sitting with max A to upright trunk and bring BLE over to EOB. Attempted STS transfer with RW, but despite rocking momentum, cues for quad/glute activation, pt +1 assist unable to clear bottom from bed. Pt agreeable to BLE strengthening exercises, but doesn't perform on RLE due to "I have no cartilage in my knee". Therapist attempted to assist pt in performing R ankle pumps and SAQ, but pt with increased pain in knee so stopped. Pt declines transfer to recliner despite encouragement and education. Pt assisted back to supine with all needs in reach- RN notified of session. Pt will benefit from continued physical therapy in hospital and recommendations below to increase strength, balance, endurance for safe ADLs and gait.   Follow Up Recommendations  SNF     Equipment Recommendations  Hospital bed    Recommendations for Other Services       Precautions / Restrictions Precautions Precautions: Fall Precaution Comments: Brain METS Restrictions Weight Bearing Restrictions: No    Mobility  Bed Mobility Overal bed mobility: Needs Assistance Bed Mobility: Supine to Sit;Sit to Supine  Supine to sit: Max assist Sit to supine: Max assist   General bed mobility comments: max A to upright trunk and bring BLE over to EOB, limlited in LLE due to knee pain, requiring constant cues for hand placement and sequencing; max A  to lift BLE back into bed and scoot up in bed using bedpad with bed assist    Transfers  General transfer comment: unable, attempted max A +1, cued for quad/glute activation with weight shift anterior to lift bottom into squatting position but unable to clear bottom from bed  Ambulation/Gait                 Stairs             Wheelchair Mobility    Modified Rankin (Stroke Patients Only)       Balance Overall balance assessment: Needs assistance Sitting-balance support: Feet supported Sitting balance-Leahy Scale: Fair Sitting balance - Comments: seated EOB without UE assist                                    Cognition Arousal/Alertness: Awake/alert Behavior During Therapy: WFL for tasks assessed/performed Overall Cognitive Status: No family/caregiver present to determine baseline cognitive functioning Area of Impairment: Attention;Following commands;Safety/judgement;Problem solving  Following Commands: Follows one step commands with increased time Safety/Judgement: Decreased awareness of deficits   Problem Solving: Slow processing;Requires verbal cues;Requires tactile cues General Comments: Pt pleasant, requires redirection to task, increased time to follow one step commands,  persevertes on feeling like she needs to void bladder.      Exercises General Exercises - Lower Extremity Ankle Circles/Pumps: Supine;AROM;Strengthening;Left;10 reps (R side unable due to "I have no cartilage in my knee") Quad Sets: Supine;AROM;Strengthening;Left;5 reps (R side unable due to "I have no cartilage in my knee") Long  Arc Quad: Seated;AROM;Strengthening;Left;10 reps (R side unable due to "I have no cartilage in my knee")    General Comments        Pertinent Vitals/Pain Pain Assessment: Faces Faces Pain Scale: Hurts even more Pain Location: R knee with activity Pain Descriptors / Indicators: Grimacing;Guarding ("there's no cartilage") Pain  Intervention(s): Limited activity within patient's tolerance;Monitored during session;Repositioned    Home Living                      Prior Function            PT Goals (current goals can now be found in the care plan section) Acute Rehab PT Goals Patient Stated Goal: to go to rehab to improve strength and ability to stand PT Goal Formulation: With patient/family Time For Goal Achievement: 02/20/21 Potential to Achieve Goals: Fair Progress towards PT goals: Progressing toward goals    Frequency    Min 2X/week      PT Plan Current plan remains appropriate    Co-evaluation              AM-PAC PT "6 Clicks" Mobility   Outcome Measure  Help needed turning from your back to your side while in a flat bed without using bedrails?: Total Help needed moving from lying on your back to sitting on the side of a flat bed without using bedrails?: Total Help needed moving to and from a bed to a chair (including a wheelchair)?: Total Help needed standing up from a chair using your arms (e.g., wheelchair or bedside chair)?: Total Help needed to walk in hospital room?: Total Help needed climbing 3-5 steps with a railing? : Total 6 Click Score: 6    End of Session Equipment Utilized During Treatment: Gait belt Activity Tolerance: Patient limited by pain Patient left: in bed;with call bell/phone within reach Nurse Communication: Mobility status;Need for lift equipment PT Visit Diagnosis: Unsteadiness on feet (R26.81);Muscle weakness (generalized) (M62.81);Difficulty in walking, not elsewhere classified (R26.2);Other symptoms and signs involving the nervous system (R29.898);Pain Pain - Right/Left: Right Pain - part of body: Knee     Time: 8184-0375 PT Time Calculation (min) (ACUTE ONLY): 17 min  Charges:  $Therapeutic Exercise: 8-22 mins                      Tori Doristine Shehan PT, DPT 02/12/21, 4:00 PM

## 2021-02-13 DIAGNOSIS — C7931 Secondary malignant neoplasm of brain: Secondary | ICD-10-CM | POA: Diagnosis not present

## 2021-02-13 NOTE — TOC Progression Note (Signed)
Transition of Care [facility]) - Progression Note    Patient Details  Name: Tammy Larsen MRN: [id number] Date of Birth: 02/20/1951  Transition of Care [facility]) CM/SW Contact  Joanne Chars, LCSW Phone Number: 02/13/2021, 2:45 PM  Clinical Narrative: CSW spoke with pt and her [name].  They are requesting help with a medicaid application.  Discussed that CSW can contact financial counseling to see if they can assist with this.  If they cannot, they would need to apply themselves through [facility].  Brother reports that they do not have adequate finances to private pay for either out of home or in home care at this time.  Email sent to Norville Haggard, financial navigator, requesting assistance.      Expected Discharge Plan: [place] Barriers to Discharge: Continued Medical Work up  Expected Discharge Plan and Services Expected Discharge Plan: [place]   Discharge Planning Services: CM Consult   Living arrangements for the past 2 months: Single Family Home                                       Social Determinants of Health (SDOH) Interventions    Readmission Risk Interventions No flowsheet data found.

## 2021-02-13 NOTE — Progress Notes (Signed)
PROGRESS NOTE    Tammy Larsen  ELF:[id number] DOB: 02/21/1951 DOA: 02/03/2021 PCP: Pcp, No    Brief Narrative:  Tammy Larsen is a 70-year-old female with past medical history significant for osteoarthritis who presented to the ED on 02/03/2021 following seizure-like activity during physical therapy at home.  No observed loss of consciousness and did not lose any bowel or bladder control.  Symptoms were described as a shaking of her right arm and leg lasting approximately 2 minutes then with reported generalized weakness.  Patient utilizes walker at baseline, but was unable to walk following event.  Patient was brought to the ED for further evaluation.  In the ED, temperature 98.9 F, BP 120/94, HR 99, RR 21, SPO2 100% on room air.  Sodium 140, potassium 4.1, chloride 101, CO2 26, glucose 105, BUN 13, creatinine 0.76.  WBC 8.4, hemoglobin 9.4, platelets 289.  CT abdomen/pelvis with multiple lung metastases, 3 x 1.9 cm soft tissue mass right breast, small lytic and sclerotic lesions left sixth rib.  CT head without contrast showed bilateral frontal lobe masses with surrounding prominent bilateral frontal and right temporal white matter hypoattenuation, no intracranial hemorrhage.  MR brain with multiple intracranial metastatic lesions largest 3.2 cm with multifocal edema but no midline shift or significant mass-effect.  Neurology and oncology was consulted.  Patient was started on Keppra and Decadron.  Hospitalist service consulted for further evaluation and management.   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  CT abdomen/pelvis with multiple lung  lesions, 3 x 1.9 cm soft tissue mass right breast and small lytic and sclerotic lesions left sixth rib.  MR brain with multiple intracranial metastatic lesions largest measuring 3.2 cm with multifocal edema without midline shift or mass-effect.  Underwent breast biopsy on 02/05/2021. Radiation/med oncology following, appreciate assistance --Continue whole brain radiation through 02/19/2021 --Will need outpatient follow-up with medical oncology, Dr. Lorenso Courier to see if she is a candidate for chemotherapy  Seizure Etiology likely secondary to wise brain brain metastases with edema. --Continue whole brain radiation as above --Keppra 500mg  PO BID --Decadron 4mg  IV q6h  Anemia of chronic disease Stable.  Goals of care --Palliative care following, appreciate assistance  Osteoarthritis: Tylenol prn  Weakness, debility, deconditioning: Seen by PT and OT with recommendations of SNF.  [employer] insurance declined authorization even with peer-to-peer review.  Likely plan will be discharged home with family support following radiation therapy completion. --continue PT/OT efforts while inpatient   DVT prophylaxis: Lovenox   Code Status: Full Code Family Communication: Updated patient extensively at bedside  Disposition Plan:  Level of care: Med-Surg Status is: Inpatient  Remains inpatient appropriate because:Unsafe d/c plan, IV treatments appropriate due to intensity of illness or inability to take PO and Inpatient level of care appropriate due to severity of illness   Dispo: The patient is from: Home              Anticipated d/c is to: Home              Anticipated d/c date is: > 3 days              Patient currently is  not medically stable to d/c.   Difficult to place patient No   Consultants:   Medical oncology  Radiation oncology  Neurology  Palliative care  Procedures:   Breast biopsy  Whole brain radiation  Antimicrobials:   None   Subjective: Patient  seen and examined bedside, resting comfortably.  Eating breakfast.  No questions or concerns at this time.  Continues with radiation during the week.  Currently unable to discharge home until finishes radiation given her profound weakness and poor mobility status.  Was declined SNF for placement by health team advantage insurance even after peer to peer review.  No other questions or concerns at this time.  Denies headache, no fever/chills/night sweats, no nausea/vomiting/diarrhea, no chest pain, palpitations, no shortness of breath, no abdominal pain.  No acute events overnight per nursing staff.  Objective: Vitals:   02/12/21 1605 02/12/21 2026 02/13/21 0430 02/13/21 1336  BP: (!) 138/98 116/63 (!) 147/78 129/71  Pulse: 81 67 (!) 52 66  Resp: 16 20 18 15   Temp: 97.9 F (36.6 C) 97.8 F (36.6 C) 98.2 F (36.8 C) 98.7 F (37.1 C)  TempSrc: Oral Oral Oral Oral  SpO2: 97% 98% 98% 97%  Weight:      Height:        Intake/Output Summary (Last 24 hours) at 02/13/2021 1437 Last data filed at 02/13/2021 1300 Gross per 24 hour  Intake 720 ml  Output 1050 ml  Net -330 ml   Filed Weights   02/07/21 0224 02/10/21 0500  Weight: 71.5 kg 74 kg    Examination:  General exam: Appears calm and comfortable  Respiratory system: Clear to auscultation. Respiratory effort normal.  On room air Cardiovascular system: S1 & S2 heard, RRR. No JVD, murmurs, rubs, gallops or clicks. No pedal edema. Gastrointestinal system: Abdomen is nondistended, soft and nontender. No organomegaly or masses felt. Normal bowel sounds heard. Central nervous system: Alert and oriented. No focal neurological deficits. Extremities: Symmetric 5 x 5 power. Skin: No rashes, lesions or ulcers Psychiatry: Judgement and insight appear normal. Mood & affect appropriate.     Data Reviewed: I have personally reviewed following labs and imaging studies  CBC: Recent Labs  Lab 02/09/21 0542  WBC 11.2*  HGB 9.8*  HCT 30.7*  MCV  69.6*  PLT 696   Basic Metabolic Panel: Recent Labs  Lab 02/09/21 0542 02/10/21 0547  NA 139  --   K 4.0  --   CL 106  --   CO2 23  --   GLUCOSE 120*  --   BUN 27*  --   CREATININE 0.64 0.48  CALCIUM 8.1*  --    GFR: Estimated Creatinine Clearance: 66.6 mL/min (by C-G formula based on SCr of 0.48 mg/dL). Liver Function Tests: No results for input(s): AST, ALT, ALKPHOS, BILITOT, PROT, ALBUMIN in the last 168 hours. No results for input(s): LIPASE, AMYLASE in the last 168 hours. No results for input(s): AMMONIA in the last 168 hours. Coagulation Profile: No results for input(s): INR, PROTIME in the last 168 hours. Cardiac Enzymes: No results for input(s): CKTOTAL, CKMB, CKMBINDEX, TROPONINI in the last 168 hours. BNP (last 3 results) No results for input(s): PROBNP in the last 8760 hours. HbA1C: No results for input(s): HGBA1C in the last 72 hours. CBG: No results for input(s): GLUCAP in the last 168 hours. Lipid Profile: No results for input(s): CHOL, HDL, LDLCALC, TRIG, CHOLHDL, LDLDIRECT in the last 72 hours. Thyroid Function Tests: No results for input(s): TSH, T4TOTAL,  FREET4, T3FREE, THYROIDAB in the last 72 hours. Anemia Panel: No results for input(s): VITAMINB12, FOLATE, FERRITIN, TIBC, IRON, RETICCTPCT in the last 72 hours. Sepsis Labs: No results for input(s): PROCALCITON, LATICACIDVEN in the last 168 hours.  Recent Results (from the past 240 hour(s))  SARS CORONAVIRUS 2 (TAT 6-24 HRS) Nasopharyngeal Nasopharyngeal Swab     Status: None   Collection Time: 02/03/21 10:12 PM   Specimen: Nasopharyngeal Swab  Result Value Ref Range Status   SARS Coronavirus 2 NEGATIVE NEGATIVE Final    Comment: (NOTE) SARS-CoV-2 target nucleic acids are NOT DETECTED.  The SARS-CoV-2 RNA is generally detectable in upper and lower respiratory specimens during the acute phase of infection. Negative results do not preclude SARS-CoV-2 infection, do not rule out co-infections with  other pathogens, and should not be used as the sole basis for treatment or other patient management decisions. Negative results must be combined with clinical observations, patient history, and epidemiological information. The expected result is Negative.  Fact Sheet for Patients: SugarRoll.be  Fact Sheet for Healthcare Providers: https://www.woods-mathews.com/  This test is not yet approved or cleared by the [place] FDA and  has been authorized for detection and/or diagnosis of SARS-CoV-2 by FDA under an Emergency Use Authorization (EUA). This EUA will remain  in effect (meaning this test can be used) for the duration of the COVID-19 declaration under Se ction 564(b)(1) of the Act, 21 U.S.C. section 360bbb-3(b)(1), unless the authorization is terminated or revoked sooner.  Performed at [facility],  [address], [place], [place] [address]          Radiology Studies: No results found.      Scheduled Meds: . dexamethasone (DECADRON) injection  4 mg Intravenous Q6H  . enoxaparin (LOVENOX) injection  40 mg Subcutaneous Q24H  . levETIRAcetam  500 mg Oral BID   Continuous Infusions:   LOS: 10 days    Time spent: 35 minutes spent on chart review, discussion with nursing staff, consultants, updating family and interview/physical exam; more than 50% of that time was spent in counseling and/or coordination of care.    Anelisse Jacobson J [place] DO [facility] Available via Epic secure chat 7am-7pm After these hours, please refer to coverage provider listed on [email or web address] 02/13/2021, 2:37 PM

## 2021-02-14 DIAGNOSIS — C7931 Secondary malignant neoplasm of brain: Secondary | ICD-10-CM

## 2021-02-14 DIAGNOSIS — R918 Other nonspecific abnormal finding of lung field: Secondary | ICD-10-CM | POA: Diagnosis not present

## 2021-02-14 DIAGNOSIS — Z7189 Other specified counseling: Secondary | ICD-10-CM | POA: Diagnosis not present

## 2021-02-14 MED ORDER — ALUM & MAG HYDROXIDE-SIMETH 200-200-20 MG/5ML PO SUSP
30.0000 mL | Freq: Four times a day (QID) | ORAL | Status: DC | PRN
  Administered 2021-02-14 (×2): 30 mL via ORAL
  Filled 2021-02-14 (×2): qty 30

## 2021-02-14 NOTE — Progress Notes (Signed)
PROGRESS NOTE    Tammy Larsen  ZOX:[id number] DOB: [birth year] DOA: 02/03/2021 PCP: Pcp, No    Brief Narrative:  Tammy Larsen is a 70-year-old female with past medical history significant for osteoarthritis who presented to the ED on 02/03/2021 following seizure-like activity during physical therapy at home.  No observed loss of consciousness and did not lose any bowel or bladder control.  Symptoms were described as a shaking of her right arm and leg lasting approximately 2 minutes then with reported generalized weakness.  Patient utilizes walker at baseline, but was unable to walk following event.  Patient was brought to the ED for further evaluation.  In the ED, temperature 98.9 F, BP 120/94, HR 99, RR 21, SPO2 100% on room air.  Sodium 140, potassium 4.1, chloride 101, CO2 26, glucose 105, BUN 13, creatinine 0.76.  WBC 8.4, hemoglobin 9.4, platelets 289.  CT abdomen/pelvis with multiple lung metastases, 3 x 1.9 cm soft tissue mass right breast, small lytic and sclerotic lesions left sixth rib.  CT head without contrast showed bilateral frontal lobe masses with surrounding prominent bilateral frontal and right temporal white matter hypoattenuation, no intracranial hemorrhage.  MR brain with multiple intracranial metastatic lesions largest 3.2 cm with multifocal edema but no midline shift or significant mass-effect.  Neurology and oncology was consulted.  Patient was started on Keppra and Decadron.  Hospitalist service consulted for further evaluation and management.   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  CT abdomen/pelvis with multiple lung  lesions, 3 x 1.9 cm soft tissue mass right breast and small lytic and sclerotic lesions left sixth rib.  MR brain with multiple intracranial metastatic lesions largest measuring 3.2 cm with multifocal edema without midline shift or mass-effect.  Underwent breast biopsy on 02/05/2021. Radiation/med oncology following, appreciate assistance --Continue whole brain radiation through 02/19/2021 --Will need outpatient follow-up with medical oncology, Dr. Lorenso Courier to see if she is a candidate for chemotherapy  Seizure Etiology likely secondary to wise brain brain metastases with edema. --Continue whole brain radiation as above --Keppra 500mg  PO BID --Decadron 4mg  IV q6h  Anemia of chronic disease Stable.  Goals of care --Palliative care following, appreciate assistance  Osteoarthritis: Tylenol prn  Weakness, debility, deconditioning: Seen by PT and OT with recommendations of SNF.  Health Team advantage insurance declined authorization even with peer-to-peer review.  Patient requires maximum assistance with mobility and any type of movement.  Anticipate patient will likely be a difficult discharge given how much assistance that she requires.   --continue PT/OT efforts while inpatient   DVT prophylaxis: Lovenox   Code Status: Full Code Family Communication: Updated patient extensively at bedside  Disposition Plan:  Level of care: Med-Surg Status is: Inpatient  Remains inpatient appropriate because:Unsafe d/c plan, IV treatments appropriate due to intensity of illness or inability to take PO and Inpatient level of care appropriate due to severity of illness   Dispo: The patient is from: Home              Anticipated d/c is to: Home              Anticipated d/c date is: > 3 days  Patient currently is not medically stable to d/c.   Difficult to place patient No   Consultants:   Medical oncology  Radiation oncology  Neurology  Palliative care  Procedures:   Breast  biopsy  Whole brain radiation  Antimicrobials:   None   Subjective: Patient seen and examined bedside, resting comfortably.  Eating breakfast.  No questions or concerns at this time.  Currently unable to discharge home until finishes radiation given her profound weakness and poor mobility status.  Was declined SNF for placement by health team advantage insurance even after peer to peer review; and anticipate difficult discharge given the amount of assistance she requires now at baseline.  No other questions or concerns at this time.  Denies headache, no fever/chills/night sweats, no nausea/vomiting/diarrhea, no chest pain, palpitations, no shortness of breath, no abdominal pain.  No acute events overnight per nursing staff.  Objective: Vitals:   02/13/21 0430 02/13/21 1336 02/13/21 2024 02/14/21 0453  BP: (!) 147/78 129/71 137/82 (!) 153/83  Pulse: (!) 52 66 67 (!) 58  Resp: 18 15 18 18   Temp: 98.2 F (36.8 C) 98.7 F (37.1 C) 98.7 F (37.1 C) 98 F (36.7 C)  TempSrc: Oral Oral Oral Oral  SpO2: 98% 97% 99% 99%  Weight:      Height:        Intake/Output Summary (Last 24 hours) at 02/14/2021 0950 Last data filed at 02/14/2021 0500 Gross per 24 hour  Intake 240 ml  Output 1600 ml  Net -1360 ml   Filed Weights   02/07/21 0224 02/10/21 0500  Weight: 71.5 kg 74 kg    Examination:  General exam: Appears calm and comfortable, weak and debilitated appearance Respiratory system: Clear to auscultation. Respiratory effort normal.  On room air Cardiovascular system: S1 & S2 heard, RRR. No JVD, murmurs, rubs, gallops or clicks. No pedal edema. Gastrointestinal system: Abdomen is nondistended, soft and nontender. No organomegaly or masses felt. Normal bowel sounds heard. Central nervous system: Alert and oriented. No focal neurological deficits. Extremities: Symmetric 5 x 5 power. Skin: No rashes, lesions or ulcers Psychiatry: Judgement and insight appear normal. Mood & affect  appropriate.     Data Reviewed: I have personally reviewed following labs and imaging studies  CBC: Recent Labs  Lab 02/09/21 0542  WBC 11.2*  HGB 9.8*  HCT 30.7*  MCV 69.6*  PLT 532   Basic Metabolic Panel: Recent Labs  Lab 02/09/21 0542 02/10/21 0547  NA 139  --   K 4.0  --   CL 106  --   CO2 23  --   GLUCOSE 120*  --   BUN 27*  --   CREATININE 0.64 0.48  CALCIUM 8.1*  --    GFR: Estimated Creatinine Clearance: 66.6 mL/min (by C-G formula based on SCr of 0.48 mg/dL). Liver Function Tests: No results for input(s): AST, ALT, ALKPHOS, BILITOT, PROT, ALBUMIN in the last 168 hours. No results for input(s): LIPASE, AMYLASE in the last 168 hours. No results for input(s): AMMONIA in the last 168 hours. Coagulation Profile: No results for input(s): INR, PROTIME in the last 168 hours. Cardiac Enzymes: No results for input(s): CKTOTAL, CKMB, CKMBINDEX, TROPONINI in the last 168 hours. BNP (last 3 results) No results for input(s): PROBNP in the last 8760 hours. HbA1C: No results for input(s): HGBA1C in the last 72 hours. CBG: No results for input(s): GLUCAP in the last 168 hours. Lipid Profile: No results for input(s): CHOL, HDL, LDLCALC, TRIG, CHOLHDL, LDLDIRECT  in the last 72 hours. Thyroid Function Tests: No results for input(s): TSH, T4TOTAL, FREET4, T3FREE, THYROIDAB in the last 72 hours. Anemia Panel: No results for input(s): VITAMINB12, FOLATE, FERRITIN, TIBC, IRON, RETICCTPCT in the last 72 hours. Sepsis Labs: No results for input(s): PROCALCITON, LATICACIDVEN in the last 168 hours.  No results found for this or any previous visit (from the past 240 hour(s)).       Radiology Studies: No results found.      Scheduled Meds: . dexamethasone (DECADRON) injection  4 mg Intravenous Q6H  . enoxaparin (LOVENOX) injection  40 mg Subcutaneous Q24H  . levETIRAcetam  500 mg Oral BID   Continuous Infusions:   LOS: 11 days    Time spent: 35 minutes spent  on chart review, discussion with nursing staff, consultants, updating family and interview/physical exam; more than 50% of that time was spent in counseling and/or coordination of care.    Richrd Kuzniar J [place] DO [facility] Available via Epic secure chat 7am-7pm After these hours, please refer to coverage provider listed on [email or web address] 02/14/2021, 9:50 AM

## 2021-02-14 NOTE — Progress Notes (Signed)
Daily Progress Note   Patient Name: Tammy Larsen       Date: 02/14/2021 DOB: Mar 25, 1951  Age: 70 y.o. MRN#: [id number] Attending Physician: [place] Eric J, DO Primary Care Physician: Pcp, No Admit Date: 02/03/2021  Reason for Consultation/Follow-up: Establishing goals of care  Subjective:  resting in bed. No distress.    Length of Stay: 11  Current Medications: Scheduled Meds:  . dexamethasone (DECADRON) injection  4 mg Intravenous Q6H  . enoxaparin (LOVENOX) injection  40 mg Subcutaneous Q24H  . levETIRAcetam  500 mg Oral BID    Continuous Infusions:   PRN Meds: acetaminophen, lip balm, ondansetron **OR** ondansetron (ZOFRAN) IV, polyethylene glycol, senna  Physical Exam         Awake alert resting in bed Regular work of breathing S 1 S 2  Abdomen not tender Has dry skin No distress   Vital Signs: BP (!) 153/83   Pulse (!) 58   Temp 98 F (36.7 C) (Oral)   Resp 18   Ht 5' 5.5" (1.664 m)   Wt 74 kg   SpO2 99%   BMI 26.74 kg/m  SpO2: SpO2: 99 % O2 Device: O2 Device: Room Air O2 Flow Rate:    Intake/output summary:   Intake/Output Summary (Last 24 hours) at 02/14/2021 1108 Last data filed at 02/14/2021 0500 Gross per 24 hour  Intake 240 ml  Output 1600 ml  Net -1360 ml   LBM: Last BM Date: 02/12/21 Baseline Weight: Weight: 71.5 kg Most recent weight: Weight: 74 kg       Palliative Assessment/Data:    Flowsheet Rows   Flowsheet Row Most Recent Value  Intake Tab   Referral Department Hospitalist  Unit at Time of Referral Oncology Unit  Palliative Care Primary Diagnosis Cancer  Date Notified 02/04/21  Palliative Care Type New Palliative care  Reason for referral Clarify Goals of Care  Date of Admission 02/03/21  Date first seen by Palliative Care  02/08/21  # of days Palliative referral response time 4 Day(s)  # of days IP prior to Palliative referral 1  Clinical Assessment   Palliative Performance Scale Score 40%  Psychosocial & Spiritual Assessment   Palliative Care Outcomes   Patient/Family meeting held? Yes  Who was at the meeting? Patient  Palliative Care Outcomes Clarified goals of care  Patient Active Problem List   Diagnosis Date Noted  . Goals of care, counseling/discussion   . Metastatic malignant neoplasm (Tammy Larsen)   . Palliative care by specialist   . Brain metastases (Tammy Larsen) 02/03/2021  . Seizure (Tammy Larsen) 02/03/2021  . Osteoarthritis 02/03/2021    Palliative Care Assessment & Plan   Patient Profile:  70 year old female with osteoarthritis, no significant PMH, has home PT for right knee, brought to the ED 02/03/2021 after she had seizure-like activity during physical therapy session of her right arm and leg. Patient was found to have multiple lung metastasis, right breast mass lighting and sclerotic lesion in the left sixth rib.  MRI brain showed multiple intracranial metastatic lesion largest 3.2 cm, patient was placed on Keppra, was admitted seen by radiation oncology, hematology oncology underwent right breast biopsy on 2/11 and transferred from [facility] to facility for further treatment. 2/14-underwent whole brain radiotherapy first session. Patient also had radiotherapy today.   Assessment:  70 year old with life limiting illness of probable metastatic breast cancer with mets to brain lung and bones.   Recommendations/Plan:   patient endorses full code, full scope care.   Ongoing multi discilipinary discussions regarding patient's current condition and prognosis.   Recommend SNF rehab with palliative, if it can be arranged for.   Patient continues to tolerate radiation, has WBR until 2-25, is to follow up with medical oncology afterwards.    Code Status:    Code Status Orders  (From admission,  onward)         Start     Ordered   02/03/21 2202  Full code  Continuous        02/03/21 2202        Code Status History    This patient has a current code status but no historical code status.   Advance Care Planning Activity       Prognosis:   Unable to determine  Discharge Planning:  To Be Determined Recommend SNF rehab with palliative on discharge.   Care plan was discussed with patient.   Thank you for allowing the Palliative Medicine Team to assist in the care of this patient.   Time In: 9 Time Out: 9.25 Total Time  25 Prolonged Time Billed  no       Greater than 50%  of this time was spent counseling and coordinating care related to the above assessment and plan.  [name], MD  Please contact Palliative Medicine Team phone at [phone] for questions and concerns.

## 2021-02-15 ENCOUNTER — Ambulatory Visit
Admit: 2021-02-15 | Discharge: 2021-02-15 | Disposition: A | Discharge status: Home / Self Care | Payer: PPO | Attending: Radiation Oncology | Admitting: Radiation Oncology

## 2021-02-15 NOTE — Care Management Important Message (Signed)
Important Message  Patient Details IM Letter given to the Patient. Name: Tammy Larsen MRN: [id number] Date of Birth: [birth year]   Medicare Important Message Given:  Yes     [name] 02/15/2021, 10:52 AM

## 2021-02-15 NOTE — Progress Notes (Addendum)
Physical Therapy Treatment Patient Details Name: Tammy Larsen MRN: [id number] DOB: 1951-08-21 Today's Date: 02/15/2021    History of Present Illness 70 y.o. female with medical history significant of osteoarthritis otherwise no significant past medical history. Pt experienced seizure-like activity on 02/03/2021 when working with [place]. CT abdomen and pelvis demonstrate multiple lung mets and R breast mass. Head CT demonstrates bilateral frontal lobe masses. Pt underwent R breast mass biopsy on 02/05/2021. Has been transferred to [facility] for radiation.    PT Comments    Pt agreeable to therapy, but hesitant to getting in recliner and agreeable for "only 30 minutes" per pt. Pt pulls to squatted stand position with BUE pulling on STEDY crossbar with mod A from elevated bed and requests to return to sitting after 10 seconds. Pt requires encouragement and cues for UE activation and glute/quad activation to power up with delayed response. Pt able to perform 2 additional reps and on 4th rep, STEDY paddles placed under pt. Pt transferred to recliner with max A for controlled lowering to sitting. Pt with limited RLE engagement with mobility, reports "bone on bone" R knee pain limiting her. Once in chair, pt again reports she wants to return to bed in 30 minutes, so encouraged pt to sit up as long as tolerable and nurse tech in room aware of pt. Pt with BLE elevated in recliner and call bell in lap. Pt will likely need +2 assist with STEDY to rise from lower seated recliner- nurse tech in room aware. Pt will benefit from continued physical therapy in hospital and recommendations below to increase strength, balance, endurance for safe ADLs and gait.    Follow Up Recommendations  SNF     Equipment Recommendations  Hospital bed    Recommendations for Other Services       Precautions / Restrictions Precautions Precautions: Fall Restrictions Weight Bearing Restrictions: No    Mobility  Bed Mobility Overal  bed mobility: Needs Assistance Bed Mobility: Sidelying to Sit  Supine to sit: Max assist  General bed mobility comments: assist to mobilize BLE over to EOB and to upright trunk with max A, constant cues for hand placement and sequencing, encouragement to continue until seated EOB    Transfers Overall transfer level: Needs assistance  Transfers: Sit to/from Stand Sit to Stand: Mod assist;From elevated surface;+2 safety/equipment  General transfer comment: elevated bed with STEDY in position, cued pt to hold cross bar with both hands and activate glutes/quads to power up into standing, pt able to clear bottom and stand in squatted position for ~10 seconds before requesting to return to sitting, completed 2 additional times, on 4th rise paddles lowered for pt to return to sitting and pt transferred to recliner, max A for controlled lowering to recliner  Ambulation/Gait  General Gait Details: unable   Stairs             Wheelchair Mobility    Modified Rankin (Stroke Patients Only)       Balance Overall balance assessment: Needs assistance Sitting-balance support: Feet supported Sitting balance-Leahy Scale: Fair Sitting balance - Comments: seated EOB without UE assist   Standing balance support: During functional activity;Bilateral upper extremity supported Standing balance-Leahy Scale: Zero Standing balance comment: STEDY for transfer       Cognition Arousal/Alertness: Awake/alert Behavior During Therapy: WFL for tasks assessed/performed Overall Cognitive Status: No family/caregiver present to determine baseline cognitive functioning  Following Commands: Follows one step commands with increased time Safety/Judgement: Decreased awareness of deficits   Problem Solving:  Slow processing;Requires verbal cues;Requires tactile cues General Comments: Pt pleasant, appears unaware of deficits, requires increased time with single step commands and multimodal cues, hesitant to  mobilize      Exercises      General Comments        Pertinent Vitals/Pain Pain Assessment: Faces Faces Pain Scale: Hurts little more Pain Location: R knee with activity Pain Descriptors / Indicators: Grimacing;Guarding Pain Intervention(s): Limited activity within patient's tolerance;Monitored during session;Repositioned    Home Living                      Prior Function            PT Goals (current goals can now be found in the care plan section) Acute Rehab PT Goals Patient Stated Goal: to go to rehab to improve strength and ability to stand PT Goal Formulation: With patient/family Time For Goal Achievement: 02/20/21 Potential to Achieve Goals: Fair Progress towards PT goals: Progressing toward goals    Frequency    Min 2X/week      PT Plan Current plan remains appropriate    Co-evaluation              AM-PAC PT "6 Clicks" Mobility   Outcome Measure  Help needed turning from your back to your side while in a flat bed without using bedrails?: Total Help needed moving from lying on your back to sitting on the side of a flat bed without using bedrails?: Total Help needed moving to and from a bed to a chair (including a wheelchair)?: Total Help needed standing up from a chair using your arms (e.g., wheelchair or bedside chair)?: Total Help needed to walk in hospital room?: Total Help needed climbing 3-5 steps with a railing? : Total 6 Click Score: 6    End of Session Equipment Utilized During Treatment: Gait belt Activity Tolerance: Patient tolerated treatment well Patient left: in chair;with call bell/phone within reach Nurse Communication: Mobility status;Need for lift equipment PT Visit Diagnosis: Unsteadiness on feet (R26.81);Muscle weakness (generalized) (M62.81);Difficulty in walking, not elsewhere classified (R26.2);Other symptoms and signs involving the nervous system (R29.898);Pain Pain - Right/Left: Right Pain - part of body: Knee      Time: 3220-2542 PT Time Calculation (min) (ACUTE ONLY): 22 min  Charges:  $Therapeutic Activity: 8-22 mins                      Tori Mattisen Pohlmann PT, DPT 02/15/21, 4:01 PM

## 2021-02-15 NOTE — Progress Notes (Signed)
PROGRESS NOTE    Tammy Larsen  KKX:[id number] DOB: 04-12-1951 DOA: 02/03/2021 PCP: Pcp, No    Brief Narrative:  Tammy Larsen is a 70-year-old female with past medical history significant for osteoarthritis who presented to the ED on 02/03/2021 following seizure-like activity during physical therapy at home.  No observed loss of consciousness and did not lose any bowel or bladder control.  Symptoms were described as a shaking of her right arm and leg lasting approximately 2 minutes then with reported generalized weakness.  Patient utilizes walker at baseline, but was unable to walk following event.  Patient was brought to the ED for further evaluation.  In the ED, temperature 98.9 F, BP 120/94, HR 99, RR 21, SPO2 100% on room air.  Sodium 140, potassium 4.1, chloride 101, CO2 26, glucose 105, BUN 13, creatinine 0.76.  WBC 8.4, hemoglobin 9.4, platelets 289.  CT abdomen/pelvis with multiple lung metastases, 3 x 1.9 cm soft tissue mass right breast, small lytic and sclerotic lesions left sixth rib.  CT head without contrast showed bilateral frontal lobe masses with surrounding prominent bilateral frontal and right temporal white matter hypoattenuation, no intracranial hemorrhage.  MR brain with multiple intracranial metastatic lesions largest 3.2 cm with multifocal edema but no midline shift or significant mass-effect.  Neurology and oncology was consulted.  Patient was started on Keppra and Decadron.  Hospitalist service consulted for further evaluation and management.   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  CT abdomen/pelvis with multiple lung lesions, 3 x 1.9 cm soft tissue mass right breast and small lytic and sclerotic lesions left sixth rib.  MR brain with multiple intracranial metastatic lesions largest measuring 3.2 cm with multifocal edema without midline shift or mass-effect.  Underwent breast biopsy on 02/05/2021. Radiation/med oncology following, appreciate assistance --Continue whole brain radiation through 02/19/2021 --Will need outpatient follow-up with medical oncology, Dr. Lorenso Courier to see if she is a candidate for chemotherapy  Seizure Etiology likely secondary to wise brain brain metastases with edema. --Continue whole brain radiation as above --Keppra 500mg  PO BID --Decadron 4mg  IV q6h  Anemia of chronic disease Stable.  Goals of care --Palliative care following, appreciate assistance  Osteoarthritis: Tylenol prn  Weakness, debility, deconditioning: Seen by PT and OT with recommendations of SNF.  [employer] insurance declined authorization even with peer-to-peer review.  Patient requires maximum assistance with mobility and any type of movement.  Anticipate patient will likely be a difficult discharge given how much assistance that she requires.   --continue PT/OT efforts while inpatient   DVT prophylaxis: Lovenox   Code Status: Full Code Family Communication: Updated patient extensively at bedside  Disposition Plan:  Level of care: Med-Surg Status is: Inpatient  Remains inpatient appropriate because:Unsafe d/c plan, IV treatments appropriate due to intensity of illness or inability to take PO and Inpatient level of care appropriate due to severity of illness   Dispo: The patient is from: Home              Anticipated d/c is to: Home  Anticipated d/c date is: > 3 days              Patient currently is not medically stable to d/c.   Difficult to place patient No   Consultants:   Medical oncology  Radiation  oncology  Neurology  Palliative care  Procedures:   Breast biopsy  Whole brain radiation  Antimicrobials:   None   Subjective: Patient seen and examined bedside, resting comfortably.  Just finished eating breakfast.  Will resume radiation therapy later this morning.  Discussed with patient concerns about going home, she states family is trying to arrange a suitable situation prior to being discharged later this week.  Questions or concerns at this time.  Denies headache, no fever/chills/night sweats, no nausea/vomiting/diarrhea, no chest pain, palpitations, no shortness of breath, no abdominal pain.  No acute events overnight per nursing staff.  Objective: Vitals:   02/14/21 0453 02/14/21 1432 02/14/21 2024 02/15/21 0457  BP: (!) 153/83 122/69 (!) 151/83 (!) 147/84  Pulse: (!) 58 73 61 (!) 58  Resp: 18 15 20 19   Temp: 98 F (36.7 C) 98.8 F (37.1 C) 97.6 F (36.4 C) 97.9 F (36.6 C)  TempSrc: Oral Oral Oral Axillary  SpO2: 99% 99% 97% 97%  Weight:      Height:        Intake/Output Summary (Last 24 hours) at 02/15/2021 1125 Last data filed at 02/15/2021 0849 Gross per 24 hour  Intake 813 ml  Output 1000 ml  Net -187 ml   Filed Weights   02/07/21 0224 02/10/21 0500  Weight: 71.5 kg 74 kg    Examination:  General exam: Appears calm and comfortable, weak and debilitated appearance Respiratory system: Clear to auscultation. Respiratory effort normal.  On room air Cardiovascular system: S1 & S2 heard, RRR. No JVD, murmurs, rubs, gallops or clicks. No pedal edema. Gastrointestinal system: Abdomen is nondistended, soft and nontender. No organomegaly or masses felt. Normal bowel sounds heard. Central nervous system: Alert and oriented. No focal neurological deficits. Extremities: Symmetric 5 x 5 power. Skin: No rashes, lesions or ulcers Psychiatry: Judgement and insight appear normal. Mood & affect appropriate.     Data Reviewed: I have personally reviewed following  labs and imaging studies  CBC: Recent Labs  Lab 02/09/21 0542  WBC 11.2*  HGB 9.8*  HCT 30.7*  MCV 69.6*  PLT 591   Basic Metabolic Panel: Recent Labs  Lab 02/09/21 0542 02/10/21 0547  NA 139  --   K 4.0  --   CL 106  --   CO2 23  --   GLUCOSE 120*  --   BUN 27*  --   CREATININE 0.64 0.48  CALCIUM 8.1*  --    GFR: Estimated Creatinine Clearance: 66.6 mL/min (by C-G formula based on SCr of 0.48 mg/dL). Liver Function Tests: No results for input(s): AST, ALT, ALKPHOS, BILITOT, PROT, ALBUMIN in the last 168 hours. No results for input(s): LIPASE, AMYLASE in the last 168 hours. No results for input(s): AMMONIA in the last 168 hours. Coagulation Profile: No results for input(s): INR, PROTIME in the last 168 hours. Cardiac Enzymes: No results for input(s): CKTOTAL, CKMB, CKMBINDEX, TROPONINI in the last 168 hours. BNP (last 3 results) No results for input(s): PROBNP in the last 8760 hours. HbA1C: No results for input(s): HGBA1C in the last 72 hours. CBG: No results for input(s): GLUCAP in the last 168 hours. Lipid Profile: No results for input(s): CHOL, HDL, LDLCALC, TRIG, CHOLHDL, LDLDIRECT in the  last 72 hours. Thyroid Function Tests: No results for input(s): TSH, T4TOTAL, FREET4, T3FREE, THYROIDAB in the last 72 hours. Anemia Panel: No results for input(s): VITAMINB12, FOLATE, FERRITIN, TIBC, IRON, RETICCTPCT in the last 72 hours. Sepsis Labs: No results for input(s): PROCALCITON, LATICACIDVEN in the last 168 hours.  No results found for this or any previous visit (from the past 240 hour(s)).       Radiology Studies: No results found.      Scheduled Meds: . dexamethasone (DECADRON) injection  4 mg Intravenous Q6H  . enoxaparin (LOVENOX) injection  40 mg Subcutaneous Q24H  . levETIRAcetam  500 mg Oral BID   Continuous Infusions:   LOS: 12 days    Time spent: 35 minutes spent on chart review, discussion with nursing staff, consultants, updating  family and interview/physical exam; more than 50% of that time was spent in counseling and/or coordination of care.    Gianna Calef J [place] DO [facility] Available via Epic secure chat 7am-7pm After these hours, please refer to coverage provider listed on [email or web address] 02/15/2021, 11:25 AM

## 2021-02-16 ENCOUNTER — Ambulatory Visit
Admit: 2021-02-16 | Discharge: 2021-02-16 | Disposition: A | Discharge status: Home / Self Care | Payer: PPO | Attending: Radiation Oncology | Admitting: Radiation Oncology

## 2021-02-16 LAB — RESP PANEL BY RT-PCR (FLU A&B, COVID) ARPGX2
Influenza A by PCR: NEGATIVE
Influenza B by PCR: NEGATIVE
SARS Coronavirus 2 by RT PCR: NEGATIVE

## 2021-02-16 NOTE — Progress Notes (Signed)
PROGRESS NOTE    Tammy Larsen  BBC:[id number] DOB: [birth year] DOA: 02/03/2021 PCP: Pcp, No    Brief Narrative:  Tammy Larsen is a 70-year-old female with past medical history significant for osteoarthritis who presented to the ED on 02/03/2021 following seizure-like activity during physical therapy at home.  No observed loss of consciousness and did not lose any bowel or bladder control.  Symptoms were described as a shaking of her right arm and leg lasting approximately 2 minutes then with reported generalized weakness.  Patient utilizes walker at baseline, but was unable to walk following event.  Patient was brought to the ED for further evaluation.  In the ED, temperature 98.9 F, BP 120/94, HR 99, RR 21, SPO2 100% on room air.  Sodium 140, potassium 4.1, chloride 101, CO2 26, glucose 105, BUN 13, creatinine 0.76.  WBC 8.4, hemoglobin 9.4, platelets 289.  CT abdomen/pelvis with multiple lung metastases, 3 x 1.9 cm soft tissue mass right breast, small lytic and sclerotic lesions left sixth rib.  CT head without contrast showed bilateral frontal lobe masses with surrounding prominent bilateral frontal and right temporal white matter hypoattenuation, no intracranial hemorrhage.  MR brain with multiple intracranial metastatic lesions largest 3.2 cm with multifocal edema but no midline shift or significant mass-effect.  Neurology and oncology was consulted.  Patient was started on Keppra and Decadron.  Hospitalist service consulted for further evaluation and management.   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  CT abdomen/pelvis with multiple lung lesions, 3 x 1.9 cm soft tissue mass right breast and small lytic and sclerotic lesions left sixth rib.  MR brain with multiple intracranial metastatic lesions largest measuring 3.2 cm with multifocal edema without midline shift or mass-effect.  Underwent breast biopsy on 02/05/2021. Radiation/med oncology following, appreciate assistance --Continue whole brain radiation through 02/19/2021 --Outpatient follow-up with medical oncology, Dr. Lorenso Courier to see if she is a candidate for chemotherapy  Seizure Etiology likely secondary to wise brain brain metastases with edema. --Continue whole brain radiation as above --Keppra 500mg  PO BID --Decadron 4mg  IV q6h  Anemia of chronic disease Stable.  Goals of care --Palliative care following, appreciate assistance  Osteoarthritis: Tylenol prn  Weakness, debility, deconditioning: Seen by PT and OT with recommendations of SNF.  [employer] insurance declined authorization even with peer-to-peer review.  Patient requires maximum assistance with mobility and any type of movement.  Anticipate patient will likely be a difficult discharge given how much assistance that she requires.   --continue PT/OT efforts while inpatient   DVT prophylaxis: Lovenox   Code Status: Full Code Family Communication: Updated patient extensively at bedside  Disposition Plan:  Level of care: Med-Surg Status is: Inpatient  Remains inpatient appropriate because:Unsafe d/c plan, IV treatments appropriate due to intensity of illness or inability to take PO and Inpatient level of care appropriate due to severity of illness   Dispo: The patient is from: Home              Anticipated d/c is to: Home  Anticipated d/c date is: > 3 days              Patient currently is not medically stable to d/c.   Difficult to place patient No   Consultants:   Medical oncology  Radiation oncology  Neurology  Palliative  care  Procedures:   Breast biopsy  Whole brain radiation  Antimicrobials:   None   Subjective: Patient seen and examined bedside, resting comfortably.  Eating breakfast.  Radiation therapy later this morning at 1045.  Discussed with patient concerns about going home, she states family is trying to arrange a suitable situation prior to being discharged later this week.  No other questions or concerns at this time.  Denies headache, no fever/chills/night sweats, no nausea/vomiting/diarrhea, no chest pain, palpitations, no shortness of breath, no abdominal pain.  No acute events overnight per nursing staff.  Objective: Vitals:   02/15/21 0457 02/15/21 1430 02/15/21 2201 02/16/21 0513  BP: (!) 147/84 (!) 143/84 131/76 138/78  Pulse: (!) 58 (!) 57 60 (!) 55  Resp: 19 18 17 17   Temp: 97.9 F (36.6 C) 98.4 F (36.9 C) 97.9 F (36.6 C) (!) 97.5 F (36.4 C)  TempSrc: Axillary Oral Oral Oral  SpO2: 97% 95% 99% 100%  Weight:      Height:        Intake/Output Summary (Last 24 hours) at 02/16/2021 1116 Last data filed at 02/16/2021 0959 Gross per 24 hour  Intake 540 ml  Output 1000 ml  Net -460 ml   Filed Weights   02/07/21 0224 02/10/21 0500  Weight: 71.5 kg 74 kg    Examination:  General exam: Appears calm and comfortable, weak and debilitated appearance Respiratory system: Clear to auscultation. Respiratory effort normal.  On room air Cardiovascular system: S1 & S2 heard, RRR. No JVD, murmurs, rubs, gallops or clicks. No pedal edema. Gastrointestinal system: Abdomen is nondistended, soft and nontender. No organomegaly or masses felt. Normal bowel sounds heard. Central nervous system: Alert and oriented. No focal neurological deficits. Extremities: Symmetric 5 x 5 power. Skin: No rashes, lesions or ulcers Psychiatry: Judgement and insight appear normal. Mood & affect appropriate.     Data Reviewed: I have personally reviewed following labs and imaging studies  CBC: No  results for input(s): WBC, NEUTROABS, HGB, HCT, MCV, PLT in the last 168 hours. Basic Metabolic Panel: Recent Labs  Lab 02/10/21 0547  CREATININE 0.48   GFR: Estimated Creatinine Clearance: 66.6 mL/min (by C-G formula based on SCr of 0.48 mg/dL). Liver Function Tests: No results for input(s): AST, ALT, ALKPHOS, BILITOT, PROT, ALBUMIN in the last 168 hours. No results for input(s): LIPASE, AMYLASE in the last 168 hours. No results for input(s): AMMONIA in the last 168 hours. Coagulation Profile: No results for input(s): INR, PROTIME in the last 168 hours. Cardiac Enzymes: No results for input(s): CKTOTAL, CKMB, CKMBINDEX, TROPONINI in the last 168 hours. BNP (last 3 results) No results for input(s): PROBNP in the last 8760 hours. HbA1C: No results for input(s): HGBA1C in the last 72 hours. CBG: No results for input(s): GLUCAP in the last 168 hours. Lipid Profile: No results for input(s): CHOL, HDL, LDLCALC, TRIG, CHOLHDL, LDLDIRECT in the last 72 hours. Thyroid Function Tests: No results for input(s): TSH, T4TOTAL, FREET4, T3FREE, THYROIDAB in the last 72 hours. Anemia Panel: No results for input(s): VITAMINB12, FOLATE, FERRITIN, TIBC, IRON, RETICCTPCT in the last 72 hours. Sepsis Labs: No results for input(s): PROCALCITON, LATICACIDVEN in the last 168 hours.  No  results found for this or any previous visit (from the past 240 hour(s)).       Radiology Studies: No results found.      Scheduled Meds: . dexamethasone (DECADRON) injection  4 mg Intravenous Q6H  . enoxaparin (LOVENOX) injection  40 mg Subcutaneous Q24H  . levETIRAcetam  500 mg Oral BID   Continuous Infusions:   LOS: 13 days    Time spent: 35 minutes spent on chart review, discussion with nursing staff, consultants, updating family and interview/physical exam; more than 50% of that time was spent in counseling and/or coordination of care.    Eric J [place] DO [facility] Available via  Epic secure chat 7am-7pm After these hours, please refer to coverage provider listed on [email or web address] 02/16/2021, 11:16 AM

## 2021-02-17 ENCOUNTER — Ambulatory Visit
Admit: 2021-02-17 | Discharge: 2021-02-17 | Disposition: A | Discharge status: Home / Self Care | Payer: PPO | Attending: Radiation Oncology | Admitting: Radiation Oncology

## 2021-02-17 DIAGNOSIS — M159 Polyosteoarthritis, unspecified: Secondary | ICD-10-CM

## 2021-02-17 LAB — CBC
HCT: 30.8 % — ABNORMAL LOW (ref 36.0–46.0)
Hemoglobin: 9.9 g/dL — ABNORMAL LOW (ref 12.0–15.0)
MCH: 22.6 pg — ABNORMAL LOW (ref 26.0–34.0)
MCHC: 32.1 g/dL (ref 30.0–36.0)
MCV: 70.2 fL — ABNORMAL LOW (ref 80.0–100.0)
Platelets: 178 10*3/uL (ref 150–400)
RBC: 4.39 MIL/uL (ref 3.87–5.11)
RDW: 18.5 % — ABNORMAL HIGH (ref 11.5–15.5)
WBC: 12.9 10*3/uL — ABNORMAL HIGH (ref 4.0–10.5)
nRBC: 0 % (ref 0.0–0.2)

## 2021-02-17 LAB — BASIC METABOLIC PANEL
Anion gap: 8 (ref 5–15)
BUN: 31 mg/dL — ABNORMAL HIGH (ref 8–23)
CO2: 26 mmol/L (ref 22–32)
Calcium: 8 mg/dL — ABNORMAL LOW (ref 8.9–10.3)
Chloride: 101 mmol/L (ref 98–111)
Creatinine, Ser: 0.69 mg/dL (ref 0.44–1.00)
GFR, Estimated: >60 mL/min (ref >60)
Glucose, Bld: 136 mg/dL — ABNORMAL HIGH (ref 70–99)
Potassium: 4.2 mmol/L (ref 3.5–5.1)
Sodium: 135 mmol/L (ref 135–145)

## 2021-02-17 LAB — MAGNESIUM: Magnesium: 2.3 mg/dL (ref 1.7–2.4)

## 2021-02-17 MED ORDER — DEXAMETHASONE 4 MG PO TABS: 4.0000 mg | ORAL_TABLET | Freq: Every day | ORAL | Status: DC

## 2021-02-17 MED ORDER — DEXAMETHASONE 4 MG PO TABS: 2.0000 mg | ORAL_TABLET | ORAL | Status: DC

## 2021-02-17 MED ORDER — DEXAMETHASONE 4 MG PO TABS: 4.0000 mg | ORAL_TABLET | Freq: Two times a day (BID) | ORAL | Status: DC

## 2021-02-17 MED ORDER — DEXAMETHASONE 4 MG PO TABS
4.0000 mg | ORAL_TABLET | Freq: Three times a day (TID) | ORAL | Status: DC
  Administered 2021-02-17 – 2021-02-19 (×6): 4 mg via ORAL
  Filled 2021-02-17 (×6): qty 1

## 2021-02-17 MED ORDER — DEXAMETHASONE 4 MG PO TABS: 2.0000 mg | ORAL_TABLET | Freq: Every day | ORAL | Status: DC

## 2021-02-17 MED ORDER — DEXAMETHASONE 4 MG PO TABS: 4.0000 mg | ORAL_TABLET | Freq: Three times a day (TID) | ORAL | Status: DC

## 2021-02-17 MED ORDER — DEXAMETHASONE 4 MG PO TABS: 4.0000 mg | ORAL_TABLET | Freq: Four times a day (QID) | ORAL | Status: DC

## 2021-02-17 NOTE — Progress Notes (Signed)
PROGRESS NOTE    Tammy Larsen  AJG:[id number] DOB: 26-Nov-1951 DOA: 02/03/2021 PCP: Pcp, No    Brief Narrative:  Mrs. 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  70 year old female past medical history for osteoarthritis who developed a seizure-like activity right arm and right leg while doing physical therapy, no loss of consciousness.  Episode lasted for about 2 minutes, having post event generalized weakness.  She was transported to ED for further evaluation. On her initial physical examination temperature 98.9, blood pressure 120/94, heart rate 99, respiratory rate 21, oxygen saturation 100% on room air.  Her lungs had coarse breath sounds bilaterally, no wheezing or rales, heart S1-S2, present, rhythmic, abdomen was soft, no lower extremity edema.  She was awake and alert, right lower and upper extremity weakness 4/5.  CT head with bilateral frontal lobe masses.  Low-attenuation central right cerebellar hemisphere focus.  No hemorrhage.  Patient was placed on Keppra and dexamethasone.  Further work-up with brain MRI showed multiple intracranial metastatic lesions, largest 3.2 cm with multifocal edema without midline shift or mass-effect.  CT chest, abdomen pelvis with numerous solid irregular pulmonary nodules scattered throughout the lungs bilaterally.  Inner right breast irregular 3.0 x 1.9 cm soft tissue mass.  Positive lytic lesion anterior left sixth rib.  Underwent right breast mass biopsy on 02/11.  Biopsy reported poorly differentiated carcinoma.  Oncology and radiation oncology were consulted, patient started radiation therapy that will complete 02/19/2021.  Assessment & Plan:   Principal Problem:   Brain metastases (Tammy Larsen) Active Problems:   Seizure (Tammy Larsen)   Osteoarthritis   Metastatic malignant neoplasm [facility]en)   Palliative care by specialist   Goals of care, counseling/discussion   Lung mass   Metastatic  cancer to brain (Tammy Larsen)   1. 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  Patient tolerating well radiation therapy, plan to continue until 02/19/21.  Continue Keppra for seizure prophylaxis, change dexamethasone to po. Neuro checks per unit protocol. Continue with PT/OT and encourage out of bed to chair tid with meals.  2. Breast cancer stage IV. (poorly differentiated carcinoma). Follow as outpatient with Dr. Lorenso Larsen for further therapy.   3. Anemia of chronic disease. Cell count has been stable, no indication for transfusion.  Consult nutrition/   4. Severe bilateral osteoarthritis. Poor mobility, at home uses a walker, continue PT and OT evaluation    Status is: Inpatient  Remains inpatient appropriate because:Inpatient level of care appropriate due to severity of illness   Dispo:  Patient From: Home  Planned Disposition: "[employer] insurance declined authorization even with peer-to-peer review.  Patient requires maximum assistance with mobility and any type of movement.  Anticipate patient will likely be a difficult discharge given how much assistance that she requires".    Expected discharge date: 02/19/2021  Medically stable for discharge: No  Difficult to place patient: Yes   DVT prophylaxis: Enoxaparin   Code Status:   full  Family Communication:  No family at the bedside      Consultants:   Oncology   Radiation oncology   IR   Procedures:   Right breast mass biopsy      Subjective: Patient continue to be very weak and deconditioned, no nausea or vomiting, no chest pain or dyspnea, no headaches or recurrent seizures.   Objective: Vitals:   02/16/21 0513 02/16/21 1400 02/16/21 2015 02/17/21 0515  BP: 138/78 Marland Kitchen)  150/72 131/71 (!) 146/82  Pulse: (!) 55 (!) 58 60 (!) 53  Resp: 17 16 16 14   Temp: (!) 97.5 F (36.4 C) 98 F (36.7 C) 98.6 F (37 C) (!) 97.5 F (36.4 C)  TempSrc: Oral Oral Oral Oral  SpO2:  100% 98% 98% 98%  Weight:      Height:        Intake/Output Summary (Last 24 hours) at 02/17/2021 1214 Last data filed at 02/17/2021 0516 Gross per 24 hour  Intake 240 ml  Output 800 ml  Net -560 ml   Filed Weights   02/07/21 0224 02/10/21 0500  Weight: 71.5 kg 74 kg    Examination:   General: Not in pain or dyspnea, deconditioned  Neurology: Awake and alert, non focal  E ENT: positive pallor, no icterus, oral mucosa moist Cardiovascular: No JVD. S1-S2 present, rhythmic, no gallops, rubs, or murmurs. No lower extremity edema. Pulmonary: positive breath sounds bilaterally Gastrointestinal. Abdomen soft and non tender Skin. No rashes Musculoskeletal: no joint deformities     Data Reviewed: I have personally reviewed following labs and imaging studies  CBC: Recent Labs  Lab 02/17/21 0538  WBC 12.9*  HGB 9.9*  HCT 30.8*  MCV 70.2*  PLT 440   Basic Metabolic Panel: Recent Labs  Lab 02/17/21 0538  NA 135  K 4.2  CL 101  CO2 26  GLUCOSE 136*  BUN 31*  CREATININE 0.69  CALCIUM 8.0*  MG 2.3   GFR: Estimated Creatinine Clearance: 66.6 mL/min (by C-G formula based on SCr of 0.69 mg/dL). Liver Function Tests: No results for input(s): AST, ALT, ALKPHOS, BILITOT, PROT, ALBUMIN in the last 168 hours. No results for input(s): LIPASE, AMYLASE in the last 168 hours. No results for input(s): AMMONIA in the last 168 hours. Coagulation Profile: No results for input(s): INR, PROTIME in the last 168 hours. Cardiac Enzymes: No results for input(s): CKTOTAL, CKMB, CKMBINDEX, TROPONINI in the last 168 hours. BNP (last 3 results) No results for input(s): PROBNP in the last 8760 hours. HbA1C: No results for input(s): HGBA1C in the last 72 hours. CBG: No results for input(s): GLUCAP in the last 168 hours. Lipid Profile: No results for input(s): CHOL, HDL, LDLCALC, TRIG, CHOLHDL, LDLDIRECT in the last 72 hours. Thyroid Function Tests: No results for input(s): TSH, T4TOTAL,  FREET4, T3FREE, THYROIDAB in the last 72 hours. Anemia Panel: No results for input(s): VITAMINB12, FOLATE, FERRITIN, TIBC, IRON, RETICCTPCT in the last 72 hours.    Radiology Studies: I have reviewed all of the imaging during this hospital visit personally     Scheduled Meds: . dexamethasone (DECADRON) injection  4 mg Intravenous Q6H  . enoxaparin (LOVENOX) injection  40 mg Subcutaneous Q24H  . levETIRAcetam  500 mg Oral BID   Continuous Infusions:   LOS: 14 days        Venesha Petraitis Gerome Apley, MD

## 2021-02-18 ENCOUNTER — Ambulatory Visit
Admit: 2021-02-18 | Discharge: 2021-02-18 | Disposition: A | Discharge status: Home / Self Care | Payer: PPO | Attending: Radiation Oncology | Admitting: Radiation Oncology

## 2021-02-18 ENCOUNTER — Ambulatory Visit: Payer: PPO | Admitting: Physician Assistant

## 2021-02-18 LAB — SARS CORONAVIRUS 2 (TAT 6-24 HRS): SARS Coronavirus 2: NEGATIVE

## 2021-02-18 MED ORDER — ADULT MULTIVITAMIN W/MINERALS CH
1.0000 | ORAL_TABLET | Freq: Every day | ORAL | Status: DC
  Administered 2021-02-18 – 2021-02-19 (×2): 1 via ORAL
  Filled 2021-02-18 (×2): qty 1

## 2021-02-18 MED ORDER — ENSURE ENLIVE PO LIQD
237.0000 mL | ORAL | Status: DC
  Administered 2021-02-18: 237 mL via ORAL

## 2021-02-18 NOTE — Progress Notes (Signed)
Initial Nutrition Assessment  INTERVENTION:   -Multivitamin with minerals daily  -Ensure Enlive po daily, each supplement provides 350 kcal and 20 grams of protein  NUTRITION DIAGNOSIS:   Increased nutrient needs related to cancer and cancer related treatments as evidenced by estimated needs.  GOAL:   Patient will meet greater than or equal to 90% of their needs  MONITOR:   PO intake,Supplement acceptance,Labs,Weight trends,I & O's  REASON FOR ASSESSMENT:   Consult Assessment of nutrition requirement/status  ASSESSMENT:   70 year old female past medical history for osteoarthritis who developed a seizure-like activity right arm and right leg while doing physical therapy, no loss of consciousness. 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  Patient is now LOS day 14. Admitted 2/9.  Pt has been undergoing radiation treatments for new breast cancer with brain and lung mets.  Pt has had a good appetite and is consuming 95-100% of meals. Will add Ensure supplement daily given increased needs from starting cancer treatment.   No weight history available PTA. Admission weight:157 lbs. Last recorded weight 2/16: 163 lbs.  Per nursing documentation, pt with mild BLE edema.  I/Os: -3.1L since 2/10 UOP: 700 ml x 24 hrs  Labs reviewed. Medications reviewed.  NUTRITION - FOCUSED PHYSICAL EXAM:  Unable to complete  Diet Order:   Diet Order            Diet regular Room service appropriate? Yes with Assist; Fluid consistency: Thin  Diet effective now                 EDUCATION NEEDS:   No education needs have been identified at this time  Skin:  Skin Assessment: Reviewed RN Assessment  Last BM:  2/22  Height:   Ht Readings from Last 1 Encounters:  02/07/21 5' 5.5" (1.664 m)    Weight:   Wt Readings from Last 1 Encounters:  02/10/21 74 kg    BMI:  Body mass index is 26.74 kg/m.  Estimated Nutritional  Needs:   Kcal:  2100-2300  Protein:  105-115g  Fluid:  2.1L/day  Clayton Bibles, MS, RD, LDN Inpatient Clinical Dietitian Contact information available via Amion

## 2021-02-18 NOTE — Progress Notes (Signed)
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  70 year old female past medical history for osteoarthritis who developed a seizure-like activity right arm and right leg while doing physical therapy, no loss of consciousness.  Episode lasted for about 2 minutes, having post event generalized weakness.  She was transported to ED for further evaluation. On her initial physical examination temperature 98.9, blood pressure 120/94, heart rate 99, respiratory rate 21, oxygen saturation 100% on room air.  Her lungs had coarse breath sounds bilaterally, no wheezing or rales, heart S1-S2, present, rhythmic, abdomen was soft, no lower extremity edema.  She was awake and alert, right lower and upper extremity weakness 4/5.  CT head with bilateral frontal lobe masses.  Low-attenuation central right cerebellar hemisphere focus.  No hemorrhage.  Patient was placed on Keppra and dexamethasone.  Further work-up with brain MRI showed multiple intracranial metastatic lesions, largest 3.2 cm with multifocal edema without midline shift or mass-effect.  CT chest, abdomen pelvis with numerous solid irregular pulmonary nodules scattered throughout the lungs bilaterally.  Inner right breast irregular 3.0 x 1.9 cm soft tissue mass.  Positive lytic lesion anterior left sixth rib.  Underwent right breast mass biopsy on 02/11.  Biopsy reported poorly differentiated carcinoma.  Oncology and radiation oncology were consulted, patient started radiation therapy that will complete 02/19/2021.   Assessment & Plan:   Principal Problem:   Brain metastases ([place]) Active Problems:   Seizure ([place])   Osteoarthritis   Metastatic malignant neoplasm [facility])   Palliative care by specialist   Goals of care, counseling/discussion   Lung mass    Metastatic cancer to brain ([place])    1. 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  Continue with radiation therapy, plan to continue until 02/19/21.  Tolerating well Keppra for seizure prophylaxis and dexamethasone.  Plan for transfer to SNF tomorrow after completing radiation therapy, follow up with oncology as outpatient.   2. Breast cancer stage IV. (poorly differentiated carcinoma). Follow up with oncology recommendations. Need to increase physical functional capacity.  3. Anemia of chronic disease. Stable cell count.   4. Severe bilateral osteoarthritis. Plan for SNF transfer in am.    Status is: Inpatient  Remains inpatient appropriate because:Inpatient level of care appropriate due to severity of illness   Dispo:  Patient From: Home  Planned Disposition: [place]  Medically stable for discharge: No  Difficult to place patient: Yes    DVT prophylaxis: Enoxaparin  Code Status:   full  Family Communication:  I spoke over the phone with the patient's sister about patient's  condition, plan of care, prognosis and all questions were addressed.    Nutrition Status: Nutrition Problem: Increased nutrient needs Etiology: cancer and cancer related treatments Signs/Symptoms: estimated needs Interventions: MVI     Consultants:  Oncology  Radiation oncology  IR   Procedures:     Antimicrobials:   Right breast mass biopsy     Subjective: Patient with no nausea or vomiting, no chest pain or dyspnea, tolerating well radiation therapy.   Objective: Vitals:   02/17/21 0515 02/17/21 1357 02/17/21 2025 02/18/21 0552  BP: (!) 146/82 127/71 133/73 (!) 167/79  Pulse: (!) 53 (!) 57 68 (!) 51  Resp: 14 17 16 16   Temp: (!) 97.5 F (36.4  C) 98.6 F (37 C) 98.1 F (36.7 C) 97.7 F (36.5 C)  TempSrc: Oral Oral Oral Oral  SpO2: 98% 98% 99% 99%  Weight:      Height:        Intake/Output Summary (Last 24  hours) at 02/18/2021 1215 Last data filed at 02/18/2021 0555 Gross per 24 hour  Intake 120 ml  Output 700 ml  Net -580 ml   Filed Weights   02/07/21 0224 02/10/21 0500  Weight: 71.5 kg 74 kg    Examination:   General: Not in pain or dyspnea, deconditioned  Neurology: Awake and alert, non focal  E ENT: mild pallor, no icterus, oral mucosa moist Cardiovascular: No JVD. S1-S2 present, rhythmic, no gallops, rubs, or murmurs. No lower extremity edema. Pulmonary: positive breath sounds bilaterally, adequate air movement, no wheezing, rhonchi or rales. Gastrointestinal. Abdomen  Soft and non tender Skin. No rashes Musculoskeletal: no joint deformities     Data Reviewed: I have personally reviewed following labs and imaging studies  CBC: Recent Labs  Lab 02/17/21 0538  WBC 12.9*  HGB 9.9*  HCT 30.8*  MCV 70.2*  PLT 425   Basic Metabolic Panel: Recent Labs  Lab 02/17/21 0538  NA 135  K 4.2  CL 101  CO2 26  GLUCOSE 136*  BUN 31*  CREATININE 0.69  CALCIUM 8.0*  MG 2.3   GFR: Estimated Creatinine Clearance: 66.6 mL/min (by C-G formula based on SCr of 0.69 mg/dL). Liver Function Tests: No results for input(s): AST, ALT, ALKPHOS, BILITOT, PROT, ALBUMIN in the last 168 hours. No results for input(s): LIPASE, AMYLASE in the last 168 hours. No results for input(s): AMMONIA in the last 168 hours. Coagulation Profile: No results for input(s): INR, PROTIME in the last 168 hours. Cardiac Enzymes: No results for input(s): CKTOTAL, CKMB, CKMBINDEX, TROPONINI in the last 168 hours. BNP (last 3 results) No results for input(s): PROBNP in the last 8760 hours. HbA1C: No results for input(s): HGBA1C in the last 72 hours. CBG: No results for input(s): GLUCAP in the last 168 hours. Lipid Profile: No results for input(s): CHOL, HDL, LDLCALC, TRIG, CHOLHDL, LDLDIRECT in the last 72 hours. Thyroid Function Tests: No results for input(s): TSH, T4TOTAL, FREET4, T3FREE, THYROIDAB in  the last 72 hours. Anemia Panel: No results for input(s): VITAMINB12, FOLATE, FERRITIN, TIBC, IRON, RETICCTPCT in the last 72 hours.    Radiology Studies: I have reviewed all of the imaging during this hospital visit personally     Scheduled Meds: . dexamethasone  4 mg Oral TID   Followed by  . [START ON 02/22/2021] dexamethasone  4 mg Oral BID   Followed by  . [START ON 03/02/2021] dexamethasone  4 mg Oral Daily   Followed by  . [START ON 03/09/2021] dexamethasone  2 mg Oral Daily   Followed by  . [START ON 03/16/2021] dexamethasone  2 mg Oral QODAY  . enoxaparin (LOVENOX) injection  40 mg Subcutaneous Q24H  . feeding supplement  237 mL Oral Q24H  . levETIRAcetam  500 mg Oral BID  . multivitamin with minerals  1 tablet Oral Daily   Continuous Infusions:   LOS: 15 days        Zaidy Absher Gerome Apley, MD

## 2021-02-18 NOTE — TOC Transition Note (Signed)
Transition of Care [facility]) - CM/SW Discharge Note   Patient Details  Name: Tammy Larsen MRN: [id number] Date of Birth: 1951/07/04  Transition of Care [facility]) CM/SW Contact:  Lynnell Catalan, RN Phone Number: 02/18/2021, 3:09 PM   Clinical Narrative:     Spoke with [employer] contact who states that pt has now been approved for short term rehab. Auth # [id number] and pt was approved for 7 days starting tomorrow. Ambulance approval continues to be valid. [facility] liaison contacted to see if they could still take pt. [facility] able to take pt tomorrow after radiation is complete. [name] and pt made aware.  Will need new covid test for dc. TOC will assist with transport to facility via [facility] tomorrow.  Final next level of care: Skilled Nursing Facility Barriers to Discharge: Continued Medical Work up   Discharge Plan and Services   Discharge Planning Services: CM Consult             Social Determinants of Health (SDOH) Interventions     Readmission Risk Interventions No flowsheet data found.

## 2021-02-18 NOTE — Progress Notes (Signed)
HEMATOLOGY-ONCOLOGY PROGRESS NOTE  SUBJECTIVE: The patient is overall doing well today.  She denies headaches, dizziness, recurrent seizures.  She reports some itching to the back of her head.  She is scheduled to complete radiation to the brain tomorrow.  Awaiting placement at SNF for rehabilitation.  REVIEW OF SYSTEMS:   Constitutional: Denies fevers, chills Eyes: Denies blurriness of vision Ears, nose, mouth, throat, and face: Denies mucositis or sore throat Respiratory: Denies cough, dyspnea or wheezes Cardiovascular: Denies palpitation, chest discomfort Gastrointestinal:  Denies nausea, heartburn or change in bowel habits Skin: Denies abnormal skin rashes Lymphatics: Denies new lymphadenopathy or easy bruising Neurological:Denies numbness, tingling or new weaknesses Behavioral/Psych: Mood is stable, no new changes  Extremities: No lower extremity edema All other systems were reviewed with the patient and are negative.  I have reviewed the past medical history, past surgical history, social history and family history with the patient and they are unchanged from previous note.   PHYSICAL EXAMINATION: ECOG PERFORMANCE STATUS: 2 - Symptomatic, <50% confined to bed  Vitals:   02/17/21 2025 02/18/21 0552  BP: 133/73 (!) 167/79  Pulse: 68 (!) 51  Resp: 16 16  Temp: 98.1 F (36.7 C) 97.7 F (36.5 C)  SpO2: 99% 99%   Filed Weights   02/07/21 0224 02/10/21 0500  Weight: 71.5 kg 74 kg    Intake/Output from previous day: 02/23 0701 - 02/24 0700 In: 600 [P.O.:600] Out: 700 [Urine:700]  GENERAL:alert, no distress and comfortable SKIN: skin color, texture, turgor are normal, no rashes or significant lesions EYES: normal, Conjunctiva are pink and non-injected, sclera clear OROPHARYNX:no exudate, no erythema and lips, buccal mucosa, and tongue normal  LUNGS: clear to auscultation and percussion with normal breathing effort HEART: regular rate & rhythm and no murmurs and no lower  extremity edema ABDOMEN:abdomen soft, non-tender and normal bowel sounds  NEURO: alert & oriented x 3 with fluent speech, no focal motor/sensory deficits  LABORATORY DATA:  I have reviewed the data as listed CMP Latest Ref Rng & Units 02/17/2021 02/10/2021 02/09/2021  Glucose 70 - 99 mg/dL 136(H) - 120(H)  BUN 8 - 23 mg/dL 31(H) - 27(H)  Creatinine 0.44 - 1.00 mg/dL 0.69 0.48 0.64  Sodium 135 - 145 mmol/L 135 - 139  Potassium 3.5 - 5.1 mmol/L 4.2 - 4.0  Chloride 98 - 111 mmol/L 101 - 106  CO2 22 - 32 mmol/L 26 - 23  Calcium 8.9 - 10.3 mg/dL 8.0(L) - 8.1(L)  Total Protein 6.5 - 8.1 g/dL - - -  Total Bilirubin 0.3 - 1.2 mg/dL - - -  Alkaline Phos 38 - 126 U/L - - -  AST 15 - 41 U/L - - -  ALT 0 - 44 U/L - - -    Lab Results  Component Value Date   WBC 12.9 (H) 02/17/2021   HGB 9.9 (L) 02/17/2021   HCT 30.8 (L) 02/17/2021   MCV 70.2 (L) 02/17/2021   PLT 178 02/17/2021   NEUTROABS 11.1 (H) 02/06/2021    CT Head Wo Contrast  Result Date: 02/03/2021 CLINICAL DATA:  Generalized weakness this morning. Numbness and tingling in the hands. No reported injury. EXAM: CT HEAD WITHOUT CONTRAST TECHNIQUE: Contiguous axial images were obtained from the base of the skull through the vertex without intravenous contrast. COMPARISON:  None. FINDINGS: Brain: There are similar bilateral frontal lobe masses measuring 2.1 cm on the right (series 3/image 16) and 2.6 cm medially and superiorly on the left (series 3/image 23) with central  low-attenuation and soft tissue rim with surrounding prominent bilateral frontal and right temporal white matter hypoattenuation. There is associated bilateral frontal and right temporal sulcal effacement. Scattered small regions of loss of gray-white matter differentiation are noted in bilateral frontal and right temporal lobes. Low-attenuation focus centrally in the right cerebellar hemisphere without significant mass effect. No evidence of parenchymal hemorrhage or extra-axial  fluid collection. No midline shift. Cerebral volume is age appropriate. No ventriculomegaly. Vascular: No acute abnormality. Skull: No evidence of calvarial fracture. Sinuses/Orbits: The visualized paranasal sinuses are essentially clear. Other:  The mastoid air cells are unopacified. IMPRESSION: 1. Bilateral frontal lobe masses, with surrounding prominent bilateral frontal and right temporal white matter greater than cortical hypoattenuation and associated sulcal effacement. Low-attenuation central right cerebellar hemisphere focus. Findings are suspicious for metastatic disease. MRI brain without and with IV contrast is recommended for further evaluation. 2. No evidence of acute intracranial hemorrhage. Critical Value/emergent results were called by telephone at the time of interpretation on 02/03/2021 at 1:18 pm to provider [facility] , who verbally acknowledged these results. Electronically Signed   By: Ilona Sorrel M.D.   On: 02/03/2021 13:22   MR Brain W and Wo Contrast  Result Date: 02/03/2021 CLINICAL DATA:  Metastatic disease follow-up EXAM: MRI HEAD WITHOUT AND WITH CONTRAST TECHNIQUE: Multiplanar, multiecho pulse sequences of the brain and surrounding structures were obtained without and with intravenous contrast. CONTRAST:  51mL GADAVIST GADOBUTROL 1 MMOL/ML IV SOLN COMPARISON:  None. FINDINGS: Brain: There are multiple intracranial metastatic lesions within both hemispheres and in the right cerebellar hemisphere. There is a small amount of associated with the right cerebellar lesion only. No intrinsic hyperintense T1-weighted signal. There is moderate edema surrounding most of the lesions, worst surrounding the lesions that are numbered 11, 9, 4 and 5 below. Lesion sizes and locations: 1. Right cerebellum, 1.4 cm, series 11, image 9 2. Medial right temporal lobe 1.1 cm image 17 3. Inferior left frontal lobe 1.3 cm image 20 4. Right frontal operculum 1.4 cm image 24 5. Posterior right frontal  operculum 1.5 cm image 24 6. Left occipital lobe 1.4 cm image 25 7. Left frontal lobe 0.6 cm image 23 8. Lateral left parietal lobe 1.4 cm image 28 9. Right frontal white matter 2.2 cm image 27 10. Right frontal periventricular white matter 1.1 cm image 28 11. Paramedian left frontal lobe 3.2 cm image 35 12. Right parietal lobe 0.8 cm image 33 13. Lateral right parietal lobe 2.0 cm image 37 14. Posteroinferior left lentiform nucleus 0.3 cm image 18 There is no midline shift, hydrocephalus or significant mass effect. Vascular: Normal flow voids. Skull and upper cervical spine: Normal marrow signal. Sinuses/Orbits: Negative. Other: None. IMPRESSION: Multiple intracranial metastatic lesions, the largest of which measures up to 3.2 cm. Moderate multifocal edema without midline shift or significant mass effect. Electronically Signed   By: Ulyses Jarred M.D.   On: 02/03/2021 19:35   CT CHEST ABDOMEN PELVIS W CONTRAST  Result Date: 02/03/2021 CLINICAL DATA:  Seizure. Abnormal head CT suspicious for brain metastases. EXAM: CT CHEST, ABDOMEN, AND PELVIS WITH CONTRAST TECHNIQUE: Multidetector CT imaging of the chest, abdomen and pelvis was performed following the standard protocol during bolus administration of intravenous contrast. CONTRAST:  79mL OMNIPAQUE IOHEXOL 300 MG/ML  SOLN COMPARISON:  None. FINDINGS: CT CHEST FINDINGS Cardiovascular: Normal heart size. No significant pericardial effusion/thickening. Atherosclerotic nonaneurysmal thoracic aorta. Top-normal caliber main pulmonary artery (3.2 cm diameter). No central pulmonary emboli. Mediastinum/Nodes: No discrete thyroid nodules.  Unremarkable esophagus. No pathologically enlarged axillary, mediastinal or hilar lymph nodes. Lungs/Pleura: No pneumothorax. No pleural effusion. No acute consolidative airspace disease. Numerous (at least 8) solid irregular pulmonary nodules scattered throughout the lungs bilaterally, largest 2.0 cm in the right upper lobe (series  5/image 42), 2.1 cm in the left lower lobe (series 5/image 78) and 1.4 cm in the left upper lobe (series 5/image 42). Musculoskeletal: Moderate thoracic spondylosis. Mild anterior T8 vertebral compression fracture of uncertain chronicity, chronic appearing. Healing subacute posterior right eleventh rib fracture without associated discrete osseous lesion. Mildly expansile mixed lytic and sclerotic anterior left sixth rib lesion (series 5/image 100). Inner right breast irregular 3.0 x 1.9 cm soft tissue mass (series 3/image 36). CT ABDOMEN PELVIS FINDINGS Hepatobiliary: Normal liver with no liver mass. Normal gallbladder with no radiopaque cholelithiasis. No biliary ductal dilatation. Pancreas: Normal, with no mass or duct dilation. Spleen: Normal size. No mass. Adrenals/Urinary Tract: Normal adrenals. Normal kidneys with no hydronephrosis and no renal mass. Normal bladder. Stomach/Bowel: Normal non-distended stomach. Normal caliber small bowel with no small bowel wall thickening. Normal candidate appendix. Questionable focal annular wall thickening versus under distention in the ascending colon (series 3/image 73). Otherwise normal large bowel. Vascular/Lymphatic: Atherosclerotic nonaneurysmal abdominal aorta. Patent portal, splenic, hepatic and renal veins. No pathologically enlarged lymph nodes in the abdomen or pelvis. Reproductive: Enlarged uterus with coarse scattered internal calcifications and multiple uterine masses, largest 5.6 cm anteriorly, presumably uterine fibroids. No adnexal masses. Other: No pneumoperitoneum, ascites or focal fluid collection. Musculoskeletal: No aggressive appearing focal osseous lesions. Marked lumbar spondylosis. IMPRESSION: 1. Numerous (at least 8) solid irregular pulmonary nodules scattered throughout the lungs bilaterally, largest 2.0 cm in the right upper lobe, compatible with pulmonary metastases. 2. Inner right breast irregular 3.0 x 1.9 cm soft tissue mass, cannot exclude  primary breast malignancy. Diagnostic mammographic evaluation recommended at a women's imaging center. 3. Small mildly expansile mixed lytic and sclerotic anterior left sixth rib lesion, cannot exclude osseous metastasis. Healing subacute posterior right eleventh rib fracture without associated discrete osseous lesion. Mild anterior T8 vertebral compression fracture of uncertain chronicity, chronic appearing. 4. No evidence of metastatic disease in the abdomen or pelvis. 5. Questionable focal annular wall thickening versus underdistention in the ascending colon. Correlation with the patient's colon cancer screening history is recommended. If screening is not up-to-date, appropriate screening should be considered. 6. Enlarged uterus with multiple uterine masses, presumably uterine fibroids. 7. Aortic Atherosclerosis (ICD10-I70.0). Electronically Signed   By: Ilona Sorrel M.D.   On: 02/03/2021 17:46   [place] CORE BIOPSY (SOFT TISSUE)  Result Date: 02/05/2021 INDICATION: 70 year old woman with right breast mass, multiple pulmonary nodules, osseous lesions, and multiple intracranial metastatic lesions presents to interventional Radiology for image guided biopsy of right breast mass. Biopsy requested in anticipation of external beam radiation treatment of brain Mets. EXAM: Ultrasound-guided biopsy of right inner breast mass. MEDICATIONS: None. ANESTHESIA/SEDATION: None COMPLICATIONS: None immediate. PROCEDURE: Informed written consent was obtained from the patient after a thorough discussion of the procedural risks, benefits and alternatives. All questions were addressed. Maximal Sterile Barrier Technique was utilized including caps, mask, sterile gowns, sterile gloves, sterile drape, hand hygiene and skin antiseptic. A timeout was performed prior to the initiation of the procedure. Patient position supine on the ultrasound table. Right inner breast skin prepped and draped in usual sterile fashion. Following local  lidocaine administration, three 16 gauge cores were obtained from the right breast mass utilizing continuous ultrasound guidance. Samples were sent to pathology  in formalin. Needle removed and hemostasis achieved with 2 minutes of manual compression. IMPRESSION: Ultrasound-guided biopsy of right inner breast mass as above. Electronically Signed   By: Miachel Roux M.D.   On: 02/05/2021 11:01    ASSESSMENT AND PLAN: Ms. Vora is a 70 year old female with no significant past medical history except for osteoarthritis. She presented to the hospital with seizure-like activity in the right arm and leg. MRI findings showed multiple lesions in her brain concerning for metastatic disease and CT scan findings also concerning for metastatic cancer. Biopsy breast was performed on 02/05/2021 and showed poorly differentiated carcinoma.  Immunophenotype consistent with adenocarcinoma and differential includes primary breast carcinoma and metastatic carcinoma.  Breast prognostic indicators showed ER/PR negative, HER-2 negative.    She is scheduled to complete radiation to the brain 02/19/2021.  She remains on Keppra and dexamethasone which is being tapered by radiation oncology.  She is awaiting placement at SNF for rehabilitation  #Metastatic breast cancer with brain lesions, lung lesions, and a rib lesion --Imaging findings have been discussed with the patient.  --Biopsy consistent with breast adenocarcinoma.  ER/PR negative, HER-2 negative. --She will complete radiation to the brain 02/19/2021. --The patient will be seen later today to discuss treatment options.  I discussed with the patient that she needs to improve her performance status before she can begin treatment. --Awaiting SNF placement. --Dexamethasone to be tapered per radiation oncology.  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  Taper per radiation oncology. --Continue dexamethasone and Keppra.   LOS: 15 days   Mikey Bussing, DNP, AGPCNP-BC, AOCNP 02/18/21

## 2021-02-18 NOTE — Progress Notes (Signed)
Physical Therapy Evaluation Patient Details Name: Tammy Larsen MRN: [id number] DOB: October 31, 1951 Today's Date: 02/18/2021   History of Present Illness  70 y.o. female with medical history significant of osteoarthritis otherwise no significant past medical history. Pt experienced seizure-like activity on 02/03/2021 when working with [place]. CT abdomen and pelvis demonstrate multiple lung mets and R breast mass. Head CT demonstrates bilateral frontal lobe masses. Pt underwent R breast mass biopsy on 02/05/2021. Has been transferred to [facility] for radiation.  Clinical Impression   Pt continues to work on progressing toward acute PT goals. Pt required MOD-MAX assist for progression of Rt LE to EOB and assist with trunk to upright and pelvis to scoot to EOB.  Pt requires MOD assist +2 for safety and assist with power up to stand with cues for hand placement on stedy. Pt progressed to increased time in standing today with sara steady and MOD assist from therapist. Continue to recommend SNF upon discharge as pt still requres increased assist for all mobility. Acute therapy to follow up during stay.        Follow Up Recommendations SNF    Equipment Recommendations  Hospital bed    Recommendations for Other Services       Precautions / Restrictions Precautions Precautions: Fall Restrictions Weight Bearing Restrictions: No      Mobility  Bed Mobility Overal bed mobility: Needs Assistance Bed Mobility: Supine to Sit     Supine to sit: Mod assist;Max assist     General bed mobility comments: MOD - MAX assist for progression of Rt LE to EOB and assist with trunk to upright and pelvis using chuck pads to scoot to EOB with cues for sequencing and hand placement of UEs on bedrail.    Transfers Overall transfer level: Needs assistance Equipment used: 2 person hand held assist Transfers: Sit to/from Stand Sit to Stand: +2 safety/equipment;+2 physical assistance;From elevated surface;Mod assist          General transfer comment: x4 from EOB, stedy, and recliner.  Pt with MOD assist +2 for safety and  assist with power up to stand with cues for hand placement on stedy. Pt was able to stand for ~18min with cues for upright posture, increased use of UEs on stedy, and MOD assist +1 during 2nd stand for therapist to assist with pericare.  Ambulation/Gait                Stairs            Wheelchair Mobility    Modified Rankin (Stroke Patients Only)       Balance Overall balance assessment: Needs assistance Sitting-balance support: Feet supported;Single extremity supported Sitting balance-Leahy Scale: Fair     Standing balance support: Bilateral upper extremity supported;During functional activity Standing balance-Leahy Scale: Zero Standing balance comment: use of stedy and assist from therapists to achieve and maintain standing position.                             Pertinent Vitals/Pain Pain Assessment: Faces Faces Pain Scale: Hurts a little bit Pain Location: R knee with activity Pain Descriptors / Indicators: Guarding;Sore Pain Intervention(s): Limited activity within patient's tolerance;Monitored during session;Repositioned    Home Living                        Prior Function                 Hand Dominance  Extremity/Trunk Assessment                Communication      Cognition Arousal/Alertness: Awake/alert Behavior During Therapy: WFL for tasks assessed/performed Overall Cognitive Status: No family/caregiver present to determine baseline cognitive functioning Area of Impairment: Memory;Problem solving                   Current Attention Level: Focused Memory: Decreased short-term memory Following Commands: Follows one step commands consistently     Problem Solving: Requires verbal cues;Requires tactile cues General Comments: pt requires verbal and tactile cues and intermittently increased time for  problem solving. Pt asking multilpe times how long to stay in chair after agreeing with therapist that she would stay up for 1hr in chair.      General Comments      Exercises     Assessment/Plan    PT Assessment    PT Problem List         PT Treatment Interventions      PT Goals (Current goals can be found in the Care Plan section)  Acute Rehab PT Goals Patient Stated Goal: to go to rehab to improve strength and ability to stand PT Goal Formulation: With patient/family Time For Goal Achievement: 02/20/21 Potential to Achieve Goals: Fair    Frequency Min 2X/week   Barriers to discharge        Co-evaluation               AM-PAC PT "6 Clicks" Mobility  Outcome Measure Help needed turning from your back to your side while in a flat bed without using bedrails?: Total Help needed moving from lying on your back to sitting on the side of a flat bed without using bedrails?: Total Help needed moving to and from a bed to a chair (including a wheelchair)?: Total Help needed standing up from a chair using your arms (e.g., wheelchair or bedside chair)?: Total Help needed to walk in hospital room?: Total Help needed climbing 3-5 steps with a railing? : Total 6 Click Score: 6    End of Session Equipment Utilized During Treatment: Gait belt Activity Tolerance: Patient tolerated treatment well Patient left: in chair;with nursing/sitter in room;with call bell/phone within reach Nurse Communication: Mobility status;Need for lift equipment (stedy +2) PT Visit Diagnosis: Unsteadiness on feet (R26.81);Muscle weakness (generalized) (M62.81);Difficulty in walking, not elsewhere classified (R26.2);Other symptoms and signs involving the nervous system (R29.898);Pain Pain - Right/Left: Right Pain - part of body: Knee    Time: 1257-1330 PT Time Calculation (min) (ACUTE ONLY): 33 min   Charges:              Elna Breslow, SPT  Acute rehab    Elna Breslow 02/18/2021, 2:36 PM

## 2021-02-19 ENCOUNTER — Ambulatory Visit: Payer: PPO | Admitting: Hematology and Oncology

## 2021-02-19 ENCOUNTER — Ambulatory Visit
Admit: 2021-02-19 | Discharge: 2021-02-19 | Disposition: A | Discharge status: Home / Self Care | Payer: PPO | Attending: Radiation Oncology | Admitting: Radiation Oncology

## 2021-02-19 ENCOUNTER — Encounter: Payer: Self-pay | Admitting: Radiation Oncology

## 2021-02-19 DIAGNOSIS — D638 Anemia in other chronic diseases classified elsewhere: Secondary | ICD-10-CM | POA: Diagnosis not present

## 2021-02-19 DIAGNOSIS — Z7401 Bed confinement status: Secondary | ICD-10-CM | POA: Diagnosis not present

## 2021-02-19 DIAGNOSIS — M1990 Unspecified osteoarthritis, unspecified site: Secondary | ICD-10-CM | POA: Diagnosis not present

## 2021-02-19 DIAGNOSIS — C50911 Malignant neoplasm of unspecified site of right female breast: Secondary | ICD-10-CM | POA: Diagnosis not present

## 2021-02-19 DIAGNOSIS — M159 Polyosteoarthritis, unspecified: Secondary | ICD-10-CM | POA: Diagnosis not present

## 2021-02-19 DIAGNOSIS — M2550 Pain in unspecified joint: Secondary | ICD-10-CM | POA: Diagnosis not present

## 2021-02-19 DIAGNOSIS — R918 Other nonspecific abnormal finding of lung field: Secondary | ICD-10-CM | POA: Diagnosis not present

## 2021-02-19 DIAGNOSIS — Z515 Encounter for palliative care: Secondary | ICD-10-CM | POA: Diagnosis not present

## 2021-02-19 DIAGNOSIS — M6281 Muscle weakness (generalized): Secondary | ICD-10-CM | POA: Diagnosis not present

## 2021-02-19 DIAGNOSIS — Z7189 Other specified counseling: Secondary | ICD-10-CM | POA: Diagnosis not present

## 2021-02-19 DIAGNOSIS — C7931 Secondary malignant neoplasm of brain: Secondary | ICD-10-CM | POA: Diagnosis not present

## 2021-02-19 DIAGNOSIS — R5381 Other malaise: Secondary | ICD-10-CM | POA: Diagnosis not present

## 2021-02-19 DIAGNOSIS — R569 Unspecified convulsions: Secondary | ICD-10-CM | POA: Diagnosis not present

## 2021-02-19 MED ORDER — ENSURE ENLIVE PO LIQD: 237.0000 mL | ORAL | 0 refills | Status: AC

## 2021-02-19 MED ORDER — ACETAMINOPHEN 325 MG PO TABS: 650.0000 mg | ORAL_TABLET | Freq: Four times a day (QID) | ORAL | Status: DC | PRN

## 2021-02-19 MED ORDER — LEVETIRACETAM 500 MG PO TABS: 500.0000 mg | ORAL_TABLET | Freq: Two times a day (BID) | ORAL | 0 refills | Status: AC

## 2021-02-19 MED ORDER — POLYETHYLENE GLYCOL 3350 17 G PO PACK: 17.0000 g | PACK | Freq: Every day | ORAL | 0 refills | Status: DC | PRN

## 2021-02-19 MED ORDER — DEXAMETHASONE 2 MG PO TABS: ORAL_TABLET | ORAL | 0 refills | Status: DC

## 2021-02-19 NOTE — TOC Transition Note (Signed)
Transition of Care [facility]) - CM/SW Discharge Note   Patient Details  Name: Tammy Larsen MRN: [id number] Date of Birth: May 25, 1951  [facility] CM/SW Contact:  Tammy Catalan, RN Phone Number: 02/19/2021, 11:10 AM   Clinical Narrative:     PT to dc to [facility]. RN to call report to [phone]. Left voicemail for brother Tammy Larsen to alert of dc. [facility] contacted for transport.   Final next level of care: Skilled Nursing Facility Barriers to Discharge: Continued Medical Work up       Discharge Plan and Services   Discharge Planning Services: CM Consult               Readmission Risk Interventions No flowsheet data found.

## 2021-02-19 NOTE — Discharge Summary (Signed)
Physician Discharge Summary  Tammy Larsen RCV:[id number] DOB: [birth year] DOA: 02/03/2021  PCP: Pcp, No  Admit date: 02/03/2021 Discharge date: 02/19/2021  Admitted From: Home  Disposition:  SNF   Recommendations for Outpatient Follow-up and new medication changes:  1. Follow up with Primary care in 7 days.  2. Competed radiation therapy on 02/19/21.  3. Dexamethasone taper as instructed.  4. Follow with oncology as outpatient 5. Continue Keppra for seizure prophylaxis.   Home Health: na   Equipment/Devices: na    Discharge Condition: stable  CODE STATUS: full  Diet recommendation: heart healthy   Brief/Interim Summary: Mrs. 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  70 year old female past medical history for osteoarthritis who developed a seizure-like activity right arm and right leg while doing physical therapy,no loss of consciousness. Episode lasted for about 2 minutes, having post event generalized weakness. She was transported to ED for further evaluation. On her initial physical examination temperature 98.9, blood pressure 120/94, heart rate 99, respiratory rate 21, oxygen saturation 100% on room air. Her lungs had coarse breath sounds bilaterally, no wheezing or rales, heart S1-S2, present, rhythmic, abdomen was soft, no lower extremity edema. She was awake and alert, right lower and upper extremity weakness 4/5.  Sodium 140, potassium 4.1, chloride 101, bicarb 26, glucose 105, BUN 13, creatinine 0.76, white count 8.4, hemoglobin 9.4, hematocrit 29.6, platelets 289. SARS COVID-19 negative. EKG 95 bpm, normal axis, normal intervals, sinus rhythm, poor R wave progression, no ST segment or T wave changes, noisy baseline.  CT head with bilateral frontal lobe masses. Low-attenuation central right cerebellar hemisphere focus. No hemorrhage.  Patient was placed on Keppra and dexamethasone. Further work-up  with brain MRI showed multiple intracranial metastatic lesions, largest 3.2 cm with multifocal edema without midline shift or mass-effect.  CT chest, abdomen pelvis with numerous solid irregular pulmonary nodules scattered throughout the lungs bilaterally. Inner right breast irregular 3.0 x 1.9 cm soft tissue mass. Positive lytic lesion anterior left sixth rib.  Underwent right breast mass biopsy on 02/11. Biopsy reported poorly differentiated carcinoma.  Oncology and radiation oncology were consulted, patient started radiation therapy that will complete 02/19/2021. Patient needs to improve her performance status before beginning cancer specific treatment.  1.  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 She was admitted to the medical ward, she had no further seizure activity, she was continued on Keppra and dexamethasone. Tolerated well radiation therapy, last session 02/19/2021. Patient will continue dexamethasone to be tapered as an outpatient. Transfer to skilled nursing facility to continue physical therapy.  2.  Breast cancer stage IV, poorly differentiated carcinoma.  Patient was evaluated by oncology.  Patient very weak and deconditioned, she will have to improve her functional status before starting specific cancer therapy.  3.  Anemia of chronic disease.  Her cell count remained stable, no PRBC transfusion.  4.  Severe bilateral lower extremity osteoarthritis.  Continue pain control and physical therapy.   Discharge Diagnoses:  Principal Problem:   Brain metastases [facility]) Active Problems:   Seizure [facility])   Osteoarthritis   Metastatic malignant neoplasm [facility])   Palliative care by specialist   Goals of care, counseling/discussion   Lung mass   Metastatic cancer to brain [facility])    Discharge Instructions   Allergies as of 02/19/2021      Reactions   Penicillins Itching      Medication List    TAKE  these medications    acetaminophen 325 MG tablet Commonly known as: TYLENOL Take 2 tablets (650 mg total) by mouth every 6 (six) hours as needed for mild pain. What changed:   medication strength  how much to take  reasons to take this   calcium carbonate 1250 (500 Ca) MG tablet Commonly known as: OS-CAL - dosed in mg of elemental calcium Take 1 tablet by mouth daily with breakfast.   cholecalciferol 25 MCG (1000 UNIT) tablet Commonly known as: VITAMIN D3 Take 1,000 Units by mouth daily.   dexamethasone 2 MG tablet Commonly known as: DECADRON Take 2 tablets three times per day for three days, then 2 tablets twice daily for seven days, then 2 tables daily for 7 days, then 1 tablet daily for 7 days, then 1 tablet every other day for 10 days.   feeding supplement Liqd Take 237 mLs by mouth daily.   fexofenadine 180 MG tablet Commonly known as: ALLEGRA Take 180 mg by mouth daily as needed for allergies or rhinitis.   Ginkgo Biloba 120 MG Caps Take 120 mg by mouth daily.   levETIRAcetam 500 MG tablet Commonly known as: KEPPRA Take 1 tablet (500 mg total) by mouth 2 (two) times daily.   methocarbamol 500 MG tablet Commonly known as: ROBAXIN Take 500 mg by mouth every 8 (eight) hours as needed for muscle pain.   polyethylene glycol 17 g packet Commonly known as: MIRALAX / GLYCOLAX Take 17 g by mouth daily as needed for moderate constipation.       Allergies  Allergen Reactions  . Penicillins Itching    Consultations:  Oncology   Radiation oncology    Procedures/Studies: CT Head Wo Contrast  Result Date: 02/03/2021 CLINICAL DATA:  Generalized weakness this morning. Numbness and tingling in the hands. No reported injury. EXAM: CT HEAD WITHOUT CONTRAST TECHNIQUE: Contiguous axial images were obtained from the base of the skull through the vertex without intravenous contrast. COMPARISON:  None. FINDINGS: Brain: There are similar bilateral frontal lobe masses measuring 2.1 cm on the  right (series 3/image 16) and 2.6 cm medially and superiorly on the left (series 3/image 23) with central low-attenuation and soft tissue rim with surrounding prominent bilateral frontal and right temporal white matter hypoattenuation. There is associated bilateral frontal and right temporal sulcal effacement. Scattered small regions of loss of gray-white matter differentiation are noted in bilateral frontal and right temporal lobes. Low-attenuation focus centrally in the right cerebellar hemisphere without significant mass effect. No evidence of parenchymal hemorrhage or extra-axial fluid collection. No midline shift. Cerebral volume is age appropriate. No ventriculomegaly. Vascular: No acute abnormality. Skull: No evidence of calvarial fracture. Sinuses/Orbits: The visualized paranasal sinuses are essentially clear. Other:  The mastoid air cells are unopacified. IMPRESSION: 1. Bilateral frontal lobe masses, with surrounding prominent bilateral frontal and right temporal white matter greater than cortical hypoattenuation and associated sulcal effacement. Low-attenuation central right cerebellar hemisphere focus. Findings are suspicious for metastatic disease. MRI brain without and with IV contrast is recommended for further evaluation. 2. No evidence of acute intracranial hemorrhage. Critical Value/emergent results were called by telephone at the time of interpretation on 02/03/2021 at 1:18 pm to provider [facility] , who verbally acknowledged these results. Electronically Signed   By: Ilona Sorrel M.D.   On: 02/03/2021 13:22   MR Brain W and Wo Contrast  Result Date: 02/03/2021 CLINICAL DATA:  Metastatic disease follow-up EXAM: MRI HEAD WITHOUT AND WITH CONTRAST TECHNIQUE: Multiplanar, multiecho pulse sequences of the  brain and surrounding structures were obtained without and with intravenous contrast. CONTRAST:  57mL GADAVIST GADOBUTROL 1 MMOL/ML IV SOLN COMPARISON:  None. FINDINGS: Brain: There are multiple  intracranial metastatic lesions within both hemispheres and in the right cerebellar hemisphere. There is a small amount of associated with the right cerebellar lesion only. No intrinsic hyperintense T1-weighted signal. There is moderate edema surrounding most of the lesions, worst surrounding the lesions that are numbered 11, 9, 4 and 5 below. Lesion sizes and locations: 1. Right cerebellum, 1.4 cm, series 11, image 9 2. Medial right temporal lobe 1.1 cm image 17 3. Inferior left frontal lobe 1.3 cm image 20 4. Right frontal operculum 1.4 cm image 24 5. Posterior right frontal operculum 1.5 cm image 24 6. Left occipital lobe 1.4 cm image 25 7. Left frontal lobe 0.6 cm image 23 8. Lateral left parietal lobe 1.4 cm image 28 9. Right frontal white matter 2.2 cm image 27 10. Right frontal periventricular white matter 1.1 cm image 28 11. Paramedian left frontal lobe 3.2 cm image 35 12. Right parietal lobe 0.8 cm image 33 13. Lateral right parietal lobe 2.0 cm image 37 14. Posteroinferior left lentiform nucleus 0.3 cm image 18 There is no midline shift, hydrocephalus or significant mass effect. Vascular: Normal flow voids. Skull and upper cervical spine: Normal marrow signal. Sinuses/Orbits: Negative. Other: None. IMPRESSION: Multiple intracranial metastatic lesions, the largest of which measures up to 3.2 cm. Moderate multifocal edema without midline shift or significant mass effect. Electronically Signed   By: Ulyses Jarred M.D.   On: 02/03/2021 19:35   CT CHEST ABDOMEN PELVIS W CONTRAST  Result Date: 02/03/2021 CLINICAL DATA:  Seizure. Abnormal head CT suspicious for brain metastases. EXAM: CT CHEST, ABDOMEN, AND PELVIS WITH CONTRAST TECHNIQUE: Multidetector CT imaging of the chest, abdomen and pelvis was performed following the standard protocol during bolus administration of intravenous contrast. CONTRAST:  1mL OMNIPAQUE IOHEXOL 300 MG/ML  SOLN COMPARISON:  None. FINDINGS: CT CHEST FINDINGS Cardiovascular: Normal  heart size. No significant pericardial effusion/thickening. Atherosclerotic nonaneurysmal thoracic aorta. Top-normal caliber main pulmonary artery (3.2 cm diameter). No central pulmonary emboli. Mediastinum/Nodes: No discrete thyroid nodules. Unremarkable esophagus. No pathologically enlarged axillary, mediastinal or hilar lymph nodes. Lungs/Pleura: No pneumothorax. No pleural effusion. No acute consolidative airspace disease. Numerous (at least 8) solid irregular pulmonary nodules scattered throughout the lungs bilaterally, largest 2.0 cm in the right upper lobe (series 5/image 42), 2.1 cm in the left lower lobe (series 5/image 78) and 1.4 cm in the left upper lobe (series 5/image 42). Musculoskeletal: Moderate thoracic spondylosis. Mild anterior T8 vertebral compression fracture of uncertain chronicity, chronic appearing. Healing subacute posterior right eleventh rib fracture without associated discrete osseous lesion. Mildly expansile mixed lytic and sclerotic anterior left sixth rib lesion (series 5/image 100). Inner right breast irregular 3.0 x 1.9 cm soft tissue mass (series 3/image 36). CT ABDOMEN PELVIS FINDINGS Hepatobiliary: Normal liver with no liver mass. Normal gallbladder with no radiopaque cholelithiasis. No biliary ductal dilatation. Pancreas: Normal, with no mass or duct dilation. Spleen: Normal size. No mass. Adrenals/Urinary Tract: Normal adrenals. Normal kidneys with no hydronephrosis and no renal mass. Normal bladder. Stomach/Bowel: Normal non-distended stomach. Normal caliber small bowel with no small bowel wall thickening. Normal candidate appendix. Questionable focal annular wall thickening versus under distention in the ascending colon (series 3/image 73). Otherwise normal large bowel. Vascular/Lymphatic: Atherosclerotic nonaneurysmal abdominal aorta. Patent portal, splenic, hepatic and renal veins. No pathologically enlarged lymph nodes in the abdomen or pelvis.  Reproductive: Enlarged  uterus with coarse scattered internal calcifications and multiple uterine masses, largest 5.6 cm anteriorly, presumably uterine fibroids. No adnexal masses. Other: No pneumoperitoneum, ascites or focal fluid collection. Musculoskeletal: No aggressive appearing focal osseous lesions. Marked lumbar spondylosis. IMPRESSION: 1. Numerous (at least 8) solid irregular pulmonary nodules scattered throughout the lungs bilaterally, largest 2.0 cm in the right upper lobe, compatible with pulmonary metastases. 2. Inner right breast irregular 3.0 x 1.9 cm soft tissue mass, cannot exclude primary breast malignancy. Diagnostic mammographic evaluation recommended at a women's imaging center. 3. Small mildly expansile mixed lytic and sclerotic anterior left sixth rib lesion, cannot exclude osseous metastasis. Healing subacute posterior right eleventh rib fracture without associated discrete osseous lesion. Mild anterior T8 vertebral compression fracture of uncertain chronicity, chronic appearing. 4. No evidence of metastatic disease in the abdomen or pelvis. 5. Questionable focal annular wall thickening versus underdistention in the ascending colon. Correlation with the patient's colon cancer screening history is recommended. If screening is not up-to-date, appropriate screening should be considered. 6. Enlarged uterus with multiple uterine masses, presumably uterine fibroids. 7. Aortic Atherosclerosis (ICD10-I70.0). Electronically Signed   By: Ilona Sorrel M.D.   On: 02/03/2021 17:46   [place] CORE BIOPSY (SOFT TISSUE)  Result Date: 02/05/2021 INDICATION: 70 year old woman with right breast mass, multiple pulmonary nodules, osseous lesions, and multiple intracranial metastatic lesions presents to interventional Radiology for image guided biopsy of right breast mass. Biopsy requested in anticipation of external beam radiation treatment of brain Mets. EXAM: Ultrasound-guided biopsy of right inner breast mass. MEDICATIONS: None.  ANESTHESIA/SEDATION: None COMPLICATIONS: None immediate. PROCEDURE: Informed written consent was obtained from the patient after a thorough discussion of the procedural risks, benefits and alternatives. All questions were addressed. Maximal Sterile Barrier Technique was utilized including caps, mask, sterile gowns, sterile gloves, sterile drape, hand hygiene and skin antiseptic. A timeout was performed prior to the initiation of the procedure. Patient position supine on the ultrasound table. Right inner breast skin prepped and draped in usual sterile fashion. Following local lidocaine administration, three 16 gauge cores were obtained from the right breast mass utilizing continuous ultrasound guidance. Samples were sent to pathology in formalin. Needle removed and hemostasis achieved with 2 minutes of manual compression. IMPRESSION: Ultrasound-guided biopsy of right inner breast mass as above. Electronically Signed   By: Miachel Roux M.D.   On: 02/05/2021 11:01        Subjective: Patient continue to have generalized weakness, no nausea or vomiting no chest pain or dyspnea,   Discharge Exam: Vitals:   02/18/21 2002 02/19/21 0521  BP: 134/71 136/80  Pulse: (!) 59 (!) 58  Resp: 14 14  Temp: 98.4 F (36.9 C) 97.8 F (36.6 C)  SpO2: 99% 99%   Vitals:   02/18/21 0552 02/18/21 1334 02/18/21 2002 02/19/21 0521  BP: (!) 167/79 117/87 134/71 136/80  Pulse: (!) 51 74 (!) 59 (!) 58  Resp: 16 16 14 14   Temp: 97.7 F (36.5 C) 99 F (37.2 C) 98.4 F (36.9 C) 97.8 F (36.6 C)  TempSrc: Oral Oral Oral Oral  SpO2: 99% 100% 99% 99%  Weight:      Height:        General: Not in pain or dyspnea  Neurology: Awake and alert, non focal  E ENT: mild pallor, no icterus, oral mucosa moist Cardiovascular: No JVD. S1-S2 present, rhythmic, no gallops, rubs, or murmurs. No lower extremity edema. Pulmonary: positive  breath sounds bilaterally, adequate air movement, no wheezing, rhonchi or  rales. Gastrointestinal. Abdomen soft and non tender Skin. No rashes Musculoskeletal: no joint deformities   The results of significant diagnostics from this hospitalization (including imaging, microbiology, ancillary and laboratory) are listed below for reference.     Microbiology: Recent Results (from the past 240 hour(s))  Resp Panel by RT-PCR (Flu A&B, Covid) Nasopharyngeal Swab     Status: None   Collection Time: 02/16/21 11:38 AM   Specimen: Nasopharyngeal Swab; Nasopharyngeal(NP) swabs in vial transport medium  Result Value Ref Range Status   SARS Coronavirus 2 by RT PCR NEGATIVE NEGATIVE Final    Comment: (NOTE) SARS-CoV-2 target nucleic acids are NOT DETECTED.  The SARS-CoV-2 RNA is generally detectable in upper respiratory specimens during the acute phase of infection. The lowest concentration of SARS-CoV-2 viral copies this assay can detect is 138 copies/mL. A negative result does not preclude SARS-Cov-2 infection and should not be used as the sole basis for treatment or other patient management decisions. A negative result may occur with  improper specimen collection/handling, submission of specimen other than nasopharyngeal swab, presence of viral mutation(s) within the areas targeted by this assay, and inadequate number of viral copies(<138 copies/mL). A negative result must be combined with clinical observations, patient history, and epidemiological information. The expected result is Negative.  Fact Sheet for Patients:  EntrepreneurPulse.com.au  Fact Sheet for Healthcare Providers:  IncredibleEmployment.be  This test is no t yet approved or cleared by the [place] FDA and  has been authorized for detection and/or diagnosis of SARS-CoV-2 by FDA under an Emergency Use Authorization (EUA). This EUA will remain  in effect (meaning this test can be used) for the duration of the COVID-19 declaration under Section 564(b)(1) of  the Act, 21 U.S.C.section 360bbb-3(b)(1), unless the authorization is terminated  or revoked sooner.       Influenza A by PCR NEGATIVE NEGATIVE Final   Influenza B by PCR NEGATIVE NEGATIVE Final    Comment: (NOTE) The Xpert Xpress SARS-CoV-2/FLU/RSV plus assay is intended as an aid in the diagnosis of influenza from Nasopharyngeal swab specimens and should not be used as a sole basis for treatment. Nasal washings and aspirates are unacceptable for Xpert Xpress SARS-CoV-2/FLU/RSV testing.  Fact Sheet for Patients: EntrepreneurPulse.com.au  Fact Sheet for Healthcare Providers: IncredibleEmployment.be  This test is not yet approved or cleared by the [place] FDA and has been authorized for detection and/or diagnosis of SARS-CoV-2 by FDA under an Emergency Use Authorization (EUA). This EUA will remain in effect (meaning this test can be used) for the duration of the COVID-19 declaration under Section 564(b)(1) of the Act, 21 U.S.C. section 360bbb-3(b)(1), unless the authorization is terminated or revoked.  Performed at [facility], [place] [address], [place], [place] [address]   SARS CORONAVIRUS 2 (TAT 6-24 HRS) Nasopharyngeal Nasopharyngeal Swab     Status: None   Collection Time: 02/18/21  2:24 PM   Specimen: Nasopharyngeal Swab  Result Value Ref Range Status   SARS Coronavirus 2 NEGATIVE NEGATIVE Final    Comment: (NOTE) SARS-CoV-2 target nucleic acids are NOT DETECTED.  The SARS-CoV-2 RNA is generally detectable in upper and lower respiratory specimens during the acute phase of infection. Negative results do not preclude SARS-CoV-2 infection, do not rule out co-infections with other pathogens, and should not be used as the sole basis for treatment or other patient management decisions. Negative results must be combined with clinical observations, patient history, and epidemiological information. The  expected result is Negative.  Fact Sheet for  Patients: SugarRoll.be  Fact Sheet for Healthcare Providers: https://www.woods-mathews.com/  This test is not yet approved or cleared by the [place] FDA and  has been authorized for detection and/or diagnosis of SARS-CoV-2 by FDA under an Emergency Use Authorization (EUA). This EUA will remain  in effect (meaning this test can be used) for the duration of the COVID-19 declaration under Se ction 564(b)(1) of the Act, 21 U.S.C. section 360bbb-3(b)(1), unless the authorization is terminated or revoked sooner.  Performed at [facility], [place] [address] [place], [place] [address]      Labs: BNP (last 3 results) No results for input(s): BNP in the last 8760 hours. Basic Metabolic Panel: Recent Labs  Lab 02/17/21 0538  NA 135  K 4.2  CL 101  CO2 26  GLUCOSE 136*  BUN 31*  CREATININE 0.69  CALCIUM 8.0*  MG 2.3   Liver Function Tests: No results for input(s): AST, ALT, ALKPHOS, BILITOT, PROT, ALBUMIN in the last 168 hours. No results for input(s): LIPASE, AMYLASE in the last 168 hours. No results for input(s): AMMONIA in the last 168 hours. CBC: Recent Labs  Lab 02/17/21 0538  WBC 12.9*  HGB 9.9*  HCT 30.8*  MCV 70.2*  PLT 178   Cardiac Enzymes: No results for input(s): CKTOTAL, CKMB, CKMBINDEX, TROPONINI in the last 168 hours. BNP: Invalid input(s): POCBNP CBG: No results for input(s): GLUCAP in the last 168 hours. D-Dimer No results for input(s): DDIMER in the last 72 hours. Hgb A1c No results for input(s): HGBA1C in the last 72 hours. Lipid Profile No results for input(s): CHOL, HDL, LDLCALC, TRIG, CHOLHDL, LDLDIRECT in the last 72 hours. Thyroid function studies No results for input(s): TSH, T4TOTAL, T3FREE, THYROIDAB in the last 72 hours.  Invalid input(s): FREET3 Anemia work up No results for input(s): VITAMINB12, FOLATE, FERRITIN, TIBC, IRON, RETICCTPCT  in the last 72 hours. Urinalysis No results found for: COLORURINE, APPEARANCEUR, [place], [place], GLUCOSEU, [place], [place], [place], PROTEINUR, UROBILINOGEN, NITRITE, LEUKOCYTESUR Sepsis Labs Invalid input(s): PROCALCITONIN,  WBC,  LACTICIDVEN Microbiology Recent Results (from the past 240 hour(s))  Resp Panel by RT-PCR (Flu A&B, Covid) Nasopharyngeal Swab     Status: None   Collection Time: 02/16/21 11:38 AM   Specimen: Nasopharyngeal Swab; Nasopharyngeal(NP) swabs in vial transport medium  Result Value Ref Range Status   SARS Coronavirus 2 by RT PCR NEGATIVE NEGATIVE Final    Comment: (NOTE) SARS-CoV-2 target nucleic acids are NOT DETECTED.  The SARS-CoV-2 RNA is generally detectable in upper respiratory specimens during the acute phase of infection. The lowest concentration of SARS-CoV-2 viral copies this assay can detect is 138 copies/mL. A negative result does not preclude SARS-Cov-2 infection and should not be used as the sole basis for treatment or other patient management decisions. A negative result may occur with  improper specimen collection/handling, submission of specimen other than nasopharyngeal swab, presence of viral mutation(s) within the areas targeted by this assay, and inadequate number of viral copies(<138 copies/mL). A negative result must be combined with clinical observations, patient history, and epidemiological information. The expected result is Negative.  Fact Sheet for Patients:  EntrepreneurPulse.com.au  Fact Sheet for Healthcare Providers:  IncredibleEmployment.be  This test is no t yet approved or cleared by the [place] FDA and  has been authorized for detection and/or diagnosis of SARS-CoV-2 by FDA under an Emergency Use Authorization (EUA). This EUA will remain  in effect (meaning this test can be used) for the duration of the COVID-19 declaration  under Section 564(b)(1) of the Act,  21 U.S.C.section 360bbb-3(b)(1), unless the authorization is terminated  or revoked sooner.       Influenza A by PCR NEGATIVE NEGATIVE Final   Influenza B by PCR NEGATIVE NEGATIVE Final    Comment: (NOTE) The Xpert Xpress SARS-CoV-2/FLU/RSV plus assay is intended as an aid in the diagnosis of influenza from Nasopharyngeal swab specimens and should not be used as a sole basis for treatment. Nasal washings and aspirates are unacceptable for Xpert Xpress SARS-CoV-2/FLU/RSV testing.  Fact Sheet for Patients: EntrepreneurPulse.com.au  Fact Sheet for Healthcare Providers: IncredibleEmployment.be  This test is not yet approved or cleared by the [place] FDA and has been authorized for detection and/or diagnosis of SARS-CoV-2 by FDA under an Emergency Use Authorization (EUA). This EUA will remain in effect (meaning this test can be used) for the duration of the COVID-19 declaration under Section 564(b)(1) of the Act, 21 U.S.C. section 360bbb-3(b)(1), unless the authorization is terminated or revoked.  Performed at [facility], [place] [address], [place], [place] [address]   SARS CORONAVIRUS 2 (TAT 6-24 HRS) Nasopharyngeal Nasopharyngeal Swab     Status: None   Collection Time: 02/18/21  2:24 PM   Specimen: Nasopharyngeal Swab  Result Value Ref Range Status   SARS Coronavirus 2 NEGATIVE NEGATIVE Final    Comment: (NOTE) SARS-CoV-2 target nucleic acids are NOT DETECTED.  The SARS-CoV-2 RNA is generally detectable in upper and lower respiratory specimens during the acute phase of infection. Negative results do not preclude SARS-CoV-2 infection, do not rule out co-infections with other pathogens, and should not be used as the sole basis for treatment or other patient management decisions. Negative results must be combined with clinical observations, patient history, and epidemiological information. The expected result is  Negative.  Fact Sheet for Patients: SugarRoll.be  Fact Sheet for Healthcare Providers: https://www.woods-mathews.com/  This test is not yet approved or cleared by the [place] FDA and  has been authorized for detection and/or diagnosis of SARS-CoV-2 by FDA under an Emergency Use Authorization (EUA). This EUA will remain  in effect (meaning this test can be used) for the duration of the COVID-19 declaration under Se ction 564(b)(1) of the Act, 21 U.S.C. section 360bbb-3(b)(1), unless the authorization is terminated or revoked sooner.  Performed at [facility], [place] [address] [place], [place] [address]      Time coordinating discharge: 45 minutes  SIGNED:   Tawni Millers, MD  [facility] 02/19/2021, 9:41 AM

## 2021-02-19 NOTE — Progress Notes (Signed)
Report given to [facility] at [facility].

## 2021-02-23 DIAGNOSIS — C7951 Secondary malignant neoplasm of bone: Secondary | ICD-10-CM | POA: Diagnosis not present

## 2021-02-23 DIAGNOSIS — C50911 Malignant neoplasm of unspecified site of right female breast: Secondary | ICD-10-CM | POA: Diagnosis not present

## 2021-02-23 DIAGNOSIS — M79604 Pain in right leg: Secondary | ICD-10-CM | POA: Diagnosis not present

## 2021-02-23 DIAGNOSIS — R070 Pain in throat: Secondary | ICD-10-CM | POA: Diagnosis not present

## 2021-02-23 DIAGNOSIS — R262 Difficulty in walking, not elsewhere classified: Secondary | ICD-10-CM | POA: Diagnosis not present

## 2021-02-23 DIAGNOSIS — R569 Unspecified convulsions: Secondary | ICD-10-CM | POA: Diagnosis not present

## 2021-02-23 DIAGNOSIS — C7800 Secondary malignant neoplasm of unspecified lung: Secondary | ICD-10-CM | POA: Diagnosis not present

## 2021-02-23 DIAGNOSIS — C7931 Secondary malignant neoplasm of brain: Secondary | ICD-10-CM | POA: Diagnosis not present

## 2021-02-23 DIAGNOSIS — R410 Disorientation, unspecified: Secondary | ICD-10-CM | POA: Diagnosis not present

## 2021-02-23 DIAGNOSIS — C50919 Malignant neoplasm of unspecified site of unspecified female breast: Secondary | ICD-10-CM | POA: Diagnosis not present

## 2021-02-23 DIAGNOSIS — Z171 Estrogen receptor negative status [ER-]: Secondary | ICD-10-CM | POA: Diagnosis not present

## 2021-02-23 DIAGNOSIS — M25561 Pain in right knee: Secondary | ICD-10-CM | POA: Diagnosis not present

## 2021-02-23 DIAGNOSIS — Z923 Personal history of irradiation: Secondary | ICD-10-CM | POA: Diagnosis not present

## 2021-02-23 DIAGNOSIS — D638 Anemia in other chronic diseases classified elsewhere: Secondary | ICD-10-CM | POA: Diagnosis not present

## 2021-02-23 DIAGNOSIS — C50111 Malignant neoplasm of central portion of right female breast: Secondary | ICD-10-CM | POA: Diagnosis not present

## 2021-02-23 DIAGNOSIS — R5383 Other fatigue: Secondary | ICD-10-CM | POA: Diagnosis not present

## 2021-02-23 DIAGNOSIS — R918 Other nonspecific abnormal finding of lung field: Secondary | ICD-10-CM | POA: Diagnosis not present

## 2021-02-23 DIAGNOSIS — G8929 Other chronic pain: Secondary | ICD-10-CM | POA: Diagnosis not present

## 2021-02-23 DIAGNOSIS — M6281 Muscle weakness (generalized): Secondary | ICD-10-CM | POA: Diagnosis not present

## 2021-02-23 DIAGNOSIS — M1990 Unspecified osteoarthritis, unspecified site: Secondary | ICD-10-CM | POA: Diagnosis not present

## 2021-03-01 ENCOUNTER — Telehealth: Payer: Self-pay | Admitting: *Deleted

## 2021-03-01 NOTE — Telephone Encounter (Signed)
Received call from patient's brother, Tammy Larsen. He is calling to find out when he can expect to have patient seen here at the cancer center.  She was an in-patient in February, received radiation treatments to her brain while an in-patient.She was found to have Stage 4 breast cancer-brain and lung mets.  She was discharged to [facility]- a skilled nursing facility. Upon reviewing t her discharge summary-of was felt that patient needed to be stronger in order to tolerate any systemic treatment. Her brother states she is still trying to regain some strength and energy. No discharge date from facility at this time.  Please advise regarding follow up appt here

## 2021-03-01 NOTE — Telephone Encounter (Signed)
Please request visit in the next 1-2 weeks with labs.

## 2021-03-03 ENCOUNTER — Telehealth: Payer: Self-pay | Admitting: Hematology and Oncology

## 2021-03-03 NOTE — Progress Notes (Signed)
  Radiation Oncology         [phone] ________________________________  Name: Tammy Larsen MRN: [id number]  Date: 02/19/2021  DOB: 1951-12-24  End of Treatment Note  Diagnosis:   70 y.o. female with multiple brain metastases and associated cerebral edema from primary breast cancer     Indication for treatment:  Palliation       Radiation treatment dates:   02/08/21-02/19/21  Site/dose:   The whole brain was treated to 30 Gy in 10 fractions of 3 Gy  Beams/energy:   Right and Left radiation fields were treated using 6 MV X-rays with custom MLC collimation to shield the eyes and face.  The patient was immobilized with a thermoplastic mask and isocenter was verified with weekly port films.  Narrative: The patient tolerated radiation treatment relatively well.     Plan: The patient has completed radiation treatment. The patient will return to radiation oncology clinic for routine followup in one month. I advised them to call or return sooner if they have any questions or concerns related to their recovery or treatment. ________________________________  Sheral Apley. [name], M.D.

## 2021-03-03 NOTE — Telephone Encounter (Signed)
Scheduling Message Entered by Aura Fey A on 03/01/2021 at 11:17 AM Priority: Routine EST PT 30  Department: [facility] ONCOLOGY  Provider: Orson Slick, MD  Scheduling Notes:  Please schedule labs and MD appt in the next 1-2 weeks . Call Johnny Ingles, pt's brother for appts.   Called - per brother he will call back to schedule an appt.

## 2021-03-04 DIAGNOSIS — C50911 Malignant neoplasm of unspecified site of right female breast: Secondary | ICD-10-CM | POA: Diagnosis not present

## 2021-03-04 DIAGNOSIS — C7931 Secondary malignant neoplasm of brain: Secondary | ICD-10-CM | POA: Diagnosis not present

## 2021-03-04 DIAGNOSIS — R569 Unspecified convulsions: Secondary | ICD-10-CM | POA: Diagnosis not present

## 2021-03-04 DIAGNOSIS — R410 Disorientation, unspecified: Secondary | ICD-10-CM | POA: Diagnosis not present

## 2021-03-08 ENCOUNTER — Telehealth: Payer: Self-pay | Admitting: Hematology and Oncology

## 2021-03-08 NOTE — Telephone Encounter (Signed)
Rescheduled past appointment. Patient's niece is aware.

## 2021-03-09 DIAGNOSIS — G8929 Other chronic pain: Secondary | ICD-10-CM | POA: Diagnosis not present

## 2021-03-09 DIAGNOSIS — R262 Difficulty in walking, not elsewhere classified: Secondary | ICD-10-CM | POA: Diagnosis not present

## 2021-03-09 DIAGNOSIS — M6281 Muscle weakness (generalized): Secondary | ICD-10-CM | POA: Diagnosis not present

## 2021-03-09 DIAGNOSIS — M25561 Pain in right knee: Secondary | ICD-10-CM | POA: Diagnosis not present

## 2021-03-11 ENCOUNTER — Other Ambulatory Visit: Payer: Self-pay | Admitting: Hematology and Oncology

## 2021-03-11 ENCOUNTER — Encounter: Payer: Self-pay | Admitting: Hematology and Oncology

## 2021-03-11 ENCOUNTER — Inpatient Hospital Stay: Payer: PPO | Attending: Hematology and Oncology | Admitting: Hematology and Oncology

## 2021-03-11 ENCOUNTER — Other Ambulatory Visit: Payer: Self-pay

## 2021-03-11 DIAGNOSIS — C50111 Malignant neoplasm of central portion of right female breast: Secondary | ICD-10-CM

## 2021-03-11 DIAGNOSIS — Z171 Estrogen receptor negative status [ER-]: Secondary | ICD-10-CM | POA: Insufficient documentation

## 2021-03-11 DIAGNOSIS — Z923 Personal history of irradiation: Secondary | ICD-10-CM | POA: Diagnosis not present

## 2021-03-11 DIAGNOSIS — C7951 Secondary malignant neoplasm of bone: Secondary | ICD-10-CM | POA: Insufficient documentation

## 2021-03-11 DIAGNOSIS — C7931 Secondary malignant neoplasm of brain: Secondary | ICD-10-CM | POA: Insufficient documentation

## 2021-03-11 DIAGNOSIS — C50919 Malignant neoplasm of unspecified site of unspecified female breast: Secondary | ICD-10-CM | POA: Diagnosis not present

## 2021-03-11 DIAGNOSIS — C7800 Secondary malignant neoplasm of unspecified lung: Secondary | ICD-10-CM | POA: Insufficient documentation

## 2021-03-11 DIAGNOSIS — C50911 Malignant neoplasm of unspecified site of right female breast: Secondary | ICD-10-CM | POA: Diagnosis not present

## 2021-03-11 NOTE — Progress Notes (Signed)
Tammy Larsen [phone]   [phone]  PROGRESS NOTE  Patient Care Team: Pcp, No as PCP - General  Hematological/Oncological History # Metastatic Triple Negative Breast Cancer 02/03/2021: presented to the ED with worsening weakness. CT Head WO showed bilateral frontal lobe masses. CT CAP showed pulmonary nodules, right breast mass, osseus metastasis in the ribs and spine. There was no evidence of disease in the abdomen/pelvis.  02/04/2021: establish with Dr. Lorenso Courier while inpatient 02/05/2021: breast mass biopsied, findings consistent with triple negative breast cancer.  2/15-2/25/2022: palliative radiation treatment to the brain  Interval History:  Tammy Larsen 70 y.o. female with medical history significant for metastatic triple negative breast cancer who presents for a follow up visit. The patient's last visit was on 02/18/2021 when she was inpatient for her radiation therapy to the brain. In the interim since the last visit Tammy Larsen completed radiation therapy.   On exam today Tammy Larsen she has been well since her discharge from the hospital.  She is unfortunately lost all of her hair from radiation but otherwise did not have any other side effects from the treatment.  She denies having any issues with her memory or mental fogging.  She notes that she is currently working on her arm strength, however she is currently not able to walk without multi person support.  She reports that her appetite is good and she is doing the best she can with physical therapy.  She denies any headache, vision changes, bone pain, or shortness of breath.  She denies any fevers, chills, sweats, nausea, vomiting or diarrhea.  A full 10 point ROS is listed below.  The bulk of our discussion today focused on the diagnosis of triple negative breast cancer and the treatment options moving forward.  MEDICAL HISTORY:  Past Medical History:  Diagnosis Date  . Arthritis      SURGICAL HISTORY: History reviewed. No pertinent surgical history.  SOCIAL HISTORY: Social History   Socioeconomic History  . Marital status: Single    Spouse name: Not on file  . Number of children: 0  . Years of education: Not on file  . Highest education level: Not on file  Occupational History  . Not on file  Tobacco Use  . Smoking status: Never Smoker  . Smokeless tobacco: Never Used  Substance and Sexual Activity  . Alcohol use: Never  . Drug use: Not on file  . Sexual activity: Not on file  Other Topics Concern  . Not on file  Social History Narrative  . Not on file   Social Determinants of Health   Financial Resource Strain: Not on file  Food Insecurity: Not on file  Transportation Needs: Not on file  Physical Activity: Not on file  Stress: Not on file  Social Connections: Not on file  Intimate Partner Violence: Not on file    FAMILY HISTORY: Family History  Problem Relation Age of Onset  . Cancer Neg Hx     ALLERGIES:  is allergic to penicillins.  MEDICATIONS:  Current Outpatient Medications  Medication Sig Dispense Refill  . acetaminophen (TYLENOL) 325 MG tablet Take 2 tablets (650 mg total) by mouth every 6 (six) hours as needed for mild pain.    . calcium carbonate (OS-CAL - DOSED IN MG OF ELEMENTAL CALCIUM) 1250 (500 Ca) MG tablet Take 1 tablet by mouth daily with breakfast.    . cholecalciferol (VITAMIN D3) 25 MCG (1000 UNIT) tablet Take 1,000 Units by mouth daily.    Marland Kitchen  dexamethasone (DECADRON) 2 MG tablet Take 2 tablets three times per day for three days, then 2 tablets twice daily for seven days, then 2 tables daily for 7 days, then 1 tablet daily for 7 days, then 1 tablet every other day for 10 days. 100 tablet 0  . feeding supplement (ENSURE ENLIVE / ENSURE PLUS) LIQD Take 237 mLs by mouth daily. 7110 mL 0  . fexofenadine (ALLEGRA) 180 MG tablet Take 180 mg by mouth daily as needed for allergies or rhinitis.    . Ginkgo Biloba 120 MG CAPS  Take 120 mg by mouth daily.    Marland Kitchen levETIRAcetam (KEPPRA) 500 MG tablet Take 1 tablet (500 mg total) by mouth 2 (two) times daily. 60 tablet 0  . methocarbamol (ROBAXIN) 500 MG tablet Take 500 mg by mouth every 8 (eight) hours as needed for muscle pain.    . polyethylene glycol (MIRALAX / GLYCOLAX) 17 g packet Take 17 g by mouth daily as needed for moderate constipation. 14 each 0   No current facility-administered medications for this visit.    REVIEW OF SYSTEMS:   Constitutional: ( - ) fevers, ( - )  chills , ( - ) night sweats Eyes: ( - ) blurriness of vision, ( - ) double vision, ( - ) watery eyes Ears, nose, mouth, throat, and face: ( - ) mucositis, ( - ) sore throat Respiratory: ( - ) cough, ( - ) dyspnea, ( - ) wheezes Cardiovascular: ( - ) palpitation, ( - ) chest discomfort, ( - ) lower extremity swelling Gastrointestinal:  ( - ) nausea, ( - ) heartburn, ( - ) change in bowel habits Skin: ( - ) abnormal skin rashes Lymphatics: ( - ) new lymphadenopathy, ( - ) easy bruising Neurological: ( - ) numbness, ( - ) tingling, ( - ) new weaknesses Behavioral/Psych: ( - ) mood change, ( - ) new changes  All other systems were reviewed with the patient and are negative.  PHYSICAL EXAMINATION: ECOG PERFORMANCE STATUS: 3 - Symptomatic, >50% confined to bed  There were no vitals filed for this visit. There were no vitals filed for this visit.  GENERAL: Chronically ill-appearing elderly African-American female in no acute distress, alert and comfortable SKIN: skin color, texture, turgor are normal, no rashes or significant lesions EYES: conjunctiva are pink and non-injected, sclera clear LUNGS: clear to auscultation and percussion with normal breathing effort HEART: regular rate & rhythm and no murmurs and no lower extremity edema Musculoskeletal: no cyanosis of digits and no clubbing  PSYCH: alert & oriented x 3, fluent speech NEURO: no focal motor/sensory deficits  LABORATORY DATA:  I  have reviewed the data as listed CBC Latest Ref Rng & Units 02/17/2021 02/09/2021 02/06/2021  WBC 4.0 - 10.5 K/uL 12.9(H) 11.2(H) 11.9(H)  Hemoglobin 12.0 - 15.0 g/dL 9.9(L) 9.8(L) 9.1(L)  Hematocrit 36.0 - 46.0 % 30.8(L) 30.7(L) 30.0(L)  Platelets 150 - 400 K/uL 178 259 295    CMP Latest Ref Rng & Units 02/17/2021 02/10/2021 02/09/2021  Glucose 70 - 99 mg/dL 136(H) - 120(H)  BUN 8 - 23 mg/dL 31(H) - 27(H)  Creatinine 0.44 - 1.00 mg/dL 0.69 0.48 0.64  Sodium 135 - 145 mmol/L 135 - 139  Potassium 3.5 - 5.1 mmol/L 4.2 - 4.0  Chloride 98 - 111 mmol/L 101 - 106  CO2 22 - 32 mmol/L 26 - 23  Calcium 8.9 - 10.3 mg/dL 8.0(L) - 8.1(L)  Total Protein 6.5 - 8.1 g/dL - - -  Total Bilirubin 0.3 - 1.2 mg/dL - - -  Alkaline Phos 38 - 126 U/L - - -  AST 15 - 41 U/L - - -  ALT 0 - 44 U/L - - -    RADIOGRAPHIC STUDIES: No results found.  ASSESSMENT & PLAN Tammy Larsen 70 y.o. female with medical history significant for metastatic triple negative breast cancer who presents for a follow up visit.  After reviewed labs, the records, discussed with the patient the findings most consistent with widely metastatic triple negative breast cancer.  Unfortunately patient has poor functional status with an ECOG approximately 3.  She is only able to stand with two-person assist and is badly deconditioned.  She is currently in a skilled nursing facility and is working with physical therapy.  She does hope she can receive treatment if it is all available to her.  Today we discussed the nature of metastatic disease and is incurable nature.  We noted that all treatments moving forward would be palliative in nature designed to shrink or slow down the growth of the tumor, but would not remove the cancer from her body.  Surgery and radiation are not options at this point given his widespread to the brain, lungs, bones from the breast.  The patient and her niece voiced understanding of her diagnosis.  At this time I think she  is a poor candidate for treatment given her marked deconditioning.  I would strongly recommend that she continue to pursue physical therapy and build up her strength and continue to eat well.  If she were to have a change in functional status we could readdress and consider chemotherapy, however I am concerned that this may not be an option for her.  If the patient were to be well enough to consider systemic treatment we would consider monotherapy with either an anthracycline or taxane.  These drugs can be offered in combination, however they may increase response rate but do not improve overall survival and increase toxicity.  Given her already frail status I think monotherapy of 1 of these agents would be the most appropriate next step if treatment were feasible.  # Metastatic Triple Negative Breast Cancer --Unfortunately the patient has markedly poor functional status with an ECOG of 3.  She is not able to stand without 2 person assist and is badly deconditioned.  I do not believe at this point time she is a candidate for chemotherapy, but I do encourage her to continue with physical therapy in order to improve her strength. --We will order an echocardiogram to help determine if anthracycline therapy may be available to her in the future. --Patient is status post palliative radiation to the brain. --We will order additional tumor markers to see if there is a possible target for therapy.  Unfortunately she is triple negative and only chemotherapy options available to her at this time. --Patient notes that she would like to pursue treatment if at all possible. --Return to clinic in approximately 3 weeks time to reassess.  No orders of the defined types were placed in this encounter.   All questions were answered. The patient knows to call the clinic with any problems, questions or concerns.  A total of more than 30 minutes were spent on this encounter and over half of that time was spent on  counseling and coordination of care as outlined above.   Ledell Peoples, MD Department of Hematology/Oncology [place] [facility] Phone: [phone] Pager: [phone] Email:  [email or web address]  03/12/2021 3:31 PM

## 2021-03-12 ENCOUNTER — Encounter: Payer: Self-pay | Admitting: Hematology and Oncology

## 2021-03-12 DIAGNOSIS — R410 Disorientation, unspecified: Secondary | ICD-10-CM | POA: Diagnosis not present

## 2021-03-12 DIAGNOSIS — C50919 Malignant neoplasm of unspecified site of unspecified female breast: Secondary | ICD-10-CM | POA: Insufficient documentation

## 2021-03-12 DIAGNOSIS — M79604 Pain in right leg: Secondary | ICD-10-CM | POA: Diagnosis not present

## 2021-03-12 DIAGNOSIS — R5383 Other fatigue: Secondary | ICD-10-CM | POA: Diagnosis not present

## 2021-03-12 DIAGNOSIS — R070 Pain in throat: Secondary | ICD-10-CM | POA: Diagnosis not present

## 2021-03-15 ENCOUNTER — Telehealth: Payer: Self-pay | Admitting: Hematology and Oncology

## 2021-03-15 NOTE — Telephone Encounter (Signed)
Scheduled appts per 3/17 los. Called pt, no answer. Left vm with appts date and times.  

## 2021-03-17 ENCOUNTER — Telehealth: Payer: Self-pay

## 2021-03-17 NOTE — Telephone Encounter (Signed)
Left patient a voicemail message in regards to telephone appointment with Freeman Caldron PA on 03/24/21 @ 9:00am. Called to review meaningful use questions. TM

## 2021-03-22 ENCOUNTER — Telehealth: Payer: Self-pay

## 2021-03-22 ENCOUNTER — Encounter (HOSPITAL_COMMUNITY): Payer: Self-pay | Admitting: Hematology and Oncology

## 2021-03-22 NOTE — Telephone Encounter (Signed)
Left voicemail message in regards to telephone follow-up with Ashlyn Bruning PA on 03/24/21 @ 9:00am. Called to review meaningful use questions. TM

## 2021-03-24 ENCOUNTER — Encounter: Payer: Self-pay | Admitting: Urology

## 2021-03-24 ENCOUNTER — Telehealth: Payer: Self-pay

## 2021-03-24 ENCOUNTER — Other Ambulatory Visit: Payer: Self-pay

## 2021-03-24 ENCOUNTER — Ambulatory Visit
Admission: RE | Admit: 2021-03-24 | Discharge: 2021-03-24 | Disposition: A | Discharge status: Home / Self Care | Payer: PPO | Source: Ambulatory Visit | Attending: Urology | Admitting: Urology

## 2021-03-24 DIAGNOSIS — C7931 Secondary malignant neoplasm of brain: Secondary | ICD-10-CM

## 2021-03-24 DIAGNOSIS — H04123 Dry eye syndrome of bilateral lacrimal glands: Secondary | ICD-10-CM | POA: Diagnosis not present

## 2021-03-24 DIAGNOSIS — H2513 Age-related nuclear cataract, bilateral: Secondary | ICD-10-CM | POA: Diagnosis not present

## 2021-03-24 DIAGNOSIS — H524 Presbyopia: Secondary | ICD-10-CM | POA: Diagnosis not present

## 2021-03-24 NOTE — Progress Notes (Signed)
Radiation Oncology         [phone] ________________________________  Name: Tammy Larsen MRN: [id number]  Date: 03/24/2021  DOB: [birth year]  Post Treatment Note  CC: Pcp, No  Elwyn Reach, MD  Diagnosis:   70 y.o.female with multiple brain metastases and associated cerebral edema from primary breast cancer     Interval Since Last Radiation:  5 weeks  02/08/21-02/19/21:  The whole brain was treated to 30 Gy in 10 fractions of 3 Gy  Narrative:  I spoke with the patient to conduct her routine scheduled 1 month follow up visit via telephone to spare the patient unnecessary potential exposure in the healthcare setting during the current COVID-19 pandemic.  The patient was notified in advance and gave permission to proceed with this visit format.  She tolerated radiation treatment relatively well without any appreciable ill side effects.                              On review of systems, the patient states that she is doing very well in general.  She specifically denies headaches, nausea, vomiting, dizziness/imbalance, difficulty with word finding or speech, decreased visual or auditory acuity, tremors or seizure activity.  She was discharged to [facility]/rehab following completion of her radiation and had been making good progress with physical therapy until this was discontinued recently, due to lack of insurance coverage/denial.  She has not been able to participate in physical therapy for the past 2 weeks because of this but they are working to get Medicaid approval for coverage so that they can resume her therapy to continue to regain strength and mobility.  She and her daughter report that she has had much improved mental clarity since completion of her radiation.  She did lose her hair but otherwise, did not experience any ill side effects.  She denies any significant fatigue and is overall, quite pleased with her progress to date.  ALLERGIES:  is allergic to  penicillins.  Meds: Current Outpatient Medications  Medication Sig Dispense Refill  . acetaminophen (TYLENOL) 325 MG tablet Take 2 tablets (650 mg total) by mouth every 6 (six) hours as needed for mild pain.    . calcium carbonate (OS-CAL - DOSED IN MG OF ELEMENTAL CALCIUM) 1250 (500 Ca) MG tablet Take 1 tablet by mouth daily with breakfast.    . cholecalciferol (VITAMIN D3) 25 MCG (1000 UNIT) tablet Take 1,000 Units by mouth daily.    Marland Kitchen dexamethasone (DECADRON) 2 MG tablet Take 2 tablets three times per day for three days, then 2 tablets twice daily for seven days, then 2 tables daily for 7 days, then 1 tablet daily for 7 days, then 1 tablet every other day for 10 days. 100 tablet 0  . fexofenadine (ALLEGRA) 180 MG tablet Take 180 mg by mouth daily as needed for allergies or rhinitis.    . Ginkgo Biloba 120 MG CAPS Take 120 mg by mouth daily.    . methocarbamol (ROBAXIN) 500 MG tablet Take 500 mg by mouth every 8 (eight) hours as needed for muscle pain.    . polyethylene glycol (MIRALAX / GLYCOLAX) 17 g packet Take 17 g by mouth daily as needed for moderate constipation. 14 each 0  . levETIRAcetam (KEPPRA) 500 MG tablet Take 1 tablet (500 mg total) by mouth 2 (two) times daily. 60 tablet 0   No current facility-administered medications for this encounter.    Physical  Findings:  vitals were not taken for this visit.   /Unable to assess due to telephone follow-up visit format.  Lab Findings: Lab Results  Component Value Date   WBC 12.9 (H) 02/17/2021   HGB 9.9 (L) 02/17/2021   HCT 30.8 (L) 02/17/2021   MCV 70.2 (L) 02/17/2021   PLT 178 02/17/2021     Radiographic Findings: No results found.  Impression/Plan: 1. 70 y.o.female with multiple brain metastases and associated cerebral edema from primary breast cancer. She appears to have recovered well from the effects of her recent whole brain radiotherapy and is currently without complaints.  We did discuss the potential for  hypersomnolence syndrome following whole brain radiation so that they would not be caught off guard if her fatigue worsens in the coming weeks.  She had a recent follow-up visit with Dr. Lorenso Courier on 03/11/2021 but was not felt to be a good candidate for systemic therapy at that time due to the need for continued rehab to regain strength and function so that she could potentially tolerate chemotherapy in the future.    Her next scheduled follow-up visit with Dr. Lorenso Courier will be 04/05/2021 and we will look forward to following along in her care.  We discussed that while we are happy to continue to participate in her care if clinically indicated, at this point, we will plan to see her back on an as-needed basis.  She and her niece appear to have a good understanding of our recommendations and are in agreement with the stated plan.     Nicholos Johns, PA-C

## 2021-03-24 NOTE — Telephone Encounter (Signed)
Spoke with patient in regards to telephone visit with Freeman Caldron PA today @ 9:00am. Reviewed meaningful use questions with patient. Patient is currently @ [employer]

## 2021-03-25 ENCOUNTER — Encounter (HOSPITAL_COMMUNITY): Payer: Self-pay

## 2021-03-26 ENCOUNTER — Other Ambulatory Visit: Payer: Self-pay

## 2021-03-26 ENCOUNTER — Ambulatory Visit (HOSPITAL_COMMUNITY)
Admission: RE | Admit: 2021-03-26 | Discharge: 2021-03-26 | Disposition: A | Discharge status: Home / Self Care | Payer: PPO | Source: Ambulatory Visit | Attending: Hematology and Oncology | Admitting: Hematology and Oncology

## 2021-03-26 DIAGNOSIS — C50919 Malignant neoplasm of unspecified site of unspecified female breast: Secondary | ICD-10-CM | POA: Diagnosis not present

## 2021-03-26 DIAGNOSIS — Z0189 Encounter for other specified special examinations: Secondary | ICD-10-CM | POA: Diagnosis not present

## 2021-03-26 LAB — ECHOCARDIOGRAM COMPLETE
Area-P 1/2: 3.03 cm2
S' Lateral: 2.9 cm

## 2021-03-26 NOTE — Progress Notes (Signed)
  Echocardiogram 2D Echocardiogram has been performed.  Fidel Levy 03/26/2021, 12:36 PM

## 2021-03-29 ENCOUNTER — Other Ambulatory Visit: Payer: Self-pay | Admitting: Hematology and Oncology

## 2021-04-02 ENCOUNTER — Telehealth: Payer: Self-pay | Admitting: Hematology and Oncology

## 2021-04-05 ENCOUNTER — Other Ambulatory Visit: Payer: PPO

## 2021-04-05 ENCOUNTER — Ambulatory Visit: Payer: PPO | Admitting: Hematology and Oncology

## 2021-04-06 ENCOUNTER — Encounter (HOSPITAL_COMMUNITY): Payer: Self-pay

## 2021-04-07 ENCOUNTER — Telehealth: Payer: Self-pay | Admitting: *Deleted

## 2021-04-07 ENCOUNTER — Telehealth: Payer: Self-pay | Admitting: Hematology and Oncology

## 2021-04-07 DIAGNOSIS — R112 Nausea with vomiting, unspecified: Secondary | ICD-10-CM | POA: Diagnosis not present

## 2021-04-07 DIAGNOSIS — R143 Flatulence: Secondary | ICD-10-CM | POA: Diagnosis not present

## 2021-04-07 NOTE — Telephone Encounter (Signed)
Scheduled appts per 4/13 sch msg. Called pt, no answer. Left msg with appt date and times.

## 2021-04-07 NOTE — Telephone Encounter (Signed)
Received call from pt's niece, Hoyle Barr. She states she has noticed patient has been declining lately as shown by decrease in cognitive abilities- speech and memory, decreased mobility and episodes of sudden nausea and vomiting, she describes as 'projectile'.  She states she is not eating as much lately, bowels are moving regularly.  She is requesting an earlier appt to have her aunt evaluated.   Advised that we can see her on Friday, 04/09/21 2 9:30 am with labs prior to MD appt.  She voiced understanding.  High priority scheduling message sent.  Dr. Lorenso Courier aware.

## 2021-04-08 DIAGNOSIS — R112 Nausea with vomiting, unspecified: Secondary | ICD-10-CM | POA: Diagnosis not present

## 2021-04-08 DIAGNOSIS — K5901 Slow transit constipation: Secondary | ICD-10-CM | POA: Diagnosis not present

## 2021-04-08 DIAGNOSIS — R1084 Generalized abdominal pain: Secondary | ICD-10-CM | POA: Diagnosis not present

## 2021-04-09 ENCOUNTER — Inpatient Hospital Stay: Payer: PPO | Admitting: Hematology and Oncology

## 2021-04-09 ENCOUNTER — Inpatient Hospital Stay: Payer: PPO | Attending: Hematology and Oncology

## 2021-04-09 ENCOUNTER — Other Ambulatory Visit: Payer: Self-pay

## 2021-04-09 ENCOUNTER — Telehealth: Payer: Self-pay | Admitting: Hematology and Oncology

## 2021-04-09 VITALS — BP 119/77 | HR 75 | Temp 97.2°F | Resp 18 | Ht 65.0 in

## 2021-04-09 DIAGNOSIS — C50911 Malignant neoplasm of unspecified site of right female breast: Secondary | ICD-10-CM | POA: Diagnosis not present

## 2021-04-09 DIAGNOSIS — C7951 Secondary malignant neoplasm of bone: Secondary | ICD-10-CM | POA: Insufficient documentation

## 2021-04-09 DIAGNOSIS — C7931 Secondary malignant neoplasm of brain: Secondary | ICD-10-CM | POA: Diagnosis not present

## 2021-04-09 DIAGNOSIS — C7800 Secondary malignant neoplasm of unspecified lung: Secondary | ICD-10-CM | POA: Diagnosis not present

## 2021-04-09 DIAGNOSIS — Z171 Estrogen receptor negative status [ER-]: Secondary | ICD-10-CM | POA: Insufficient documentation

## 2021-04-09 LAB — CBC WITH DIFFERENTIAL (CANCER CENTER ONLY)
Abs Immature Granulocytes: 0.19 10*3/uL — ABNORMAL HIGH (ref 0.00–0.07)
Basophils Absolute: 0.1 10*3/uL (ref 0.0–0.1)
Basophils Relative: 1 %
Eosinophils Absolute: 0.4 10*3/uL (ref 0.0–0.5)
Eosinophils Relative: 4 %
HCT: 34.3 % — ABNORMAL LOW (ref 36.0–46.0)
Hemoglobin: 10.6 g/dL — ABNORMAL LOW (ref 12.0–15.0)
Immature Granulocytes: 2 %
Lymphocytes Relative: 18 %
Lymphs Abs: 1.8 10*3/uL (ref 0.7–4.0)
MCH: 22.4 pg — ABNORMAL LOW (ref 26.0–34.0)
MCHC: 30.9 g/dL (ref 30.0–36.0)
MCV: 72.4 fL — ABNORMAL LOW (ref 80.0–100.0)
Monocytes Absolute: 0.9 10*3/uL (ref 0.1–1.0)
Monocytes Relative: 8 %
Neutro Abs: 7.1 10*3/uL (ref 1.7–7.7)
Neutrophils Relative %: 67 %
Platelet Count: 265 10*3/uL (ref 150–400)
RBC: 4.74 MIL/uL (ref 3.87–5.11)
RDW: 19.8 % — ABNORMAL HIGH (ref 11.5–15.5)
WBC Count: 10.5 10*3/uL (ref 4.0–10.5)
nRBC: 0 % (ref 0.0–0.2)

## 2021-04-09 LAB — CMP (CANCER CENTER ONLY)
ALT: <6 U/L (ref 0–44)
AST: 11 U/L — ABNORMAL LOW (ref 15–41)
Albumin: 2.9 g/dL — ABNORMAL LOW (ref 3.5–5.0)
Alkaline Phosphatase: 61 U/L (ref 38–126)
Anion gap: 11 (ref 5–15)
BUN: 10 mg/dL (ref 8–23)
CO2: 28 mmol/L (ref 22–32)
Calcium: 8.9 mg/dL (ref 8.9–10.3)
Chloride: 100 mmol/L (ref 98–111)
Creatinine: 0.63 mg/dL (ref 0.44–1.00)
GFR, Estimated: >60 mL/min (ref >60)
Glucose, Bld: 93 mg/dL (ref 70–99)
Potassium: 4.2 mmol/L (ref 3.5–5.1)
Sodium: 139 mmol/L (ref 135–145)
Total Bilirubin: 0.4 mg/dL (ref 0.3–1.2)
Total Protein: 6.4 g/dL — ABNORMAL LOW (ref 6.5–8.1)

## 2021-04-09 NOTE — Patient Instructions (Signed)
PRN Medication to include:  --zofran 8mg  every 8 Hours as needed and compazine 10mg  every 6 Hours as needed for nausea

## 2021-04-09 NOTE — Telephone Encounter (Signed)
Scheduled follow-up appointment per 4/15 los. Patient is aware.

## 2021-04-13 ENCOUNTER — Encounter: Payer: Self-pay | Admitting: Hematology and Oncology

## 2021-04-13 NOTE — Progress Notes (Signed)
[place] Telephone:(336) [phone]   [phone]  PROGRESS NOTE  Patient Care Team: Pcp, No as PCP - General  Hematological/Oncological History # Metastatic Triple Negative Breast Cancer 02/03/2021: presented to the ED with worsening weakness. CT Head WO showed bilateral frontal lobe masses. CT CAP showed pulmonary nodules, right breast mass, osseus metastasis in the ribs and spine. There was no evidence of disease in the abdomen/pelvis.  02/04/2021: establish with Dr. Lorenso Courier while inpatient 02/05/2021: breast mass biopsied, findings consistent with triple negative breast cancer.  2/15-2/25/2022: palliative radiation treatment to the brain  Interval History:  Tammy Larsen 70 y.o. female with medical history significant for metastatic triple negative breast cancer who presents for a follow up visit. The patient's last visit was on 03/25/2021. In the interim since the last visit Tammy Larsen has not had much performance status improvement.  On exam today Tammy Larsen reports that she is not in any pain today.  She did have several episodes of nausea and vomiting earlier this week, though she was not administered nausea pills promptly.  She reports that she has not had any nausea or vomiting since that time.  She unfortunately has also not been receiving any additional physical therapy since her last discussion mostly due to insurance reasons.  Overall she reports that she is weaker than prior.  She denies any headache, vision changes, bone pain, or shortness of breath.  She denies any fevers, chills, sweats, nausea, vomiting or diarrhea.  A full 10 point ROS is listed below.   MEDICAL HISTORY:  Past Medical History:  Diagnosis Date  . Arthritis     SURGICAL HISTORY: No past surgical history on file.  SOCIAL HISTORY: Social History   Socioeconomic History  . Marital status: Single    Spouse name: Not on file  . Number of children: 0  . Years of education: Not on  file  . Highest education level: Not on file  Occupational History  . Not on file  Tobacco Use  . Smoking status: Never Smoker  . Smokeless tobacco: Never Used  Substance and Sexual Activity  . Alcohol use: Never  . Drug use: Not on file  . Sexual activity: Not on file  Other Topics Concern  . Not on file  Social History Narrative  . Not on file   Social Determinants of Health   Financial Resource Strain: Not on file  Food Insecurity: Not on file  Transportation Needs: Not on file  Physical Activity: Not on file  Stress: Not on file  Social Connections: Not on file  Intimate Partner Violence: Not on file    FAMILY HISTORY: Family History  Problem Relation Age of Onset  . Cancer Neg Hx     ALLERGIES:  is allergic to penicillins.  MEDICATIONS:  Current Outpatient Medications  Medication Sig Dispense Refill  . acetaminophen (TYLENOL) 325 MG tablet Take 2 tablets (650 mg total) by mouth every 6 (six) hours as needed for mild pain.    . calcium carbonate (OS-CAL - DOSED IN MG OF ELEMENTAL CALCIUM) 1250 (500 Ca) MG tablet Take 1 tablet by mouth daily with breakfast.    . cholecalciferol (VITAMIN D3) 25 MCG (1000 UNIT) tablet Take 1,000 Units by mouth daily.    Marland Kitchen dexamethasone (DECADRON) 2 MG tablet Take 2 tablets three times per day for three days, then 2 tablets twice daily for seven days, then 2 tables daily for 7 days, then 1 tablet daily for 7 days, then 1  tablet every other day for 10 days. 100 tablet 0  . fexofenadine (ALLEGRA) 180 MG tablet Take 180 mg by mouth daily as needed for allergies or rhinitis.    . Ginkgo Biloba 120 MG CAPS Take 120 mg by mouth daily.    Marland Kitchen levETIRAcetam (KEPPRA) 500 MG tablet Take 1 tablet (500 mg total) by mouth 2 (two) times daily. 60 tablet 0  . methocarbamol (ROBAXIN) 500 MG tablet Take 500 mg by mouth every 8 (eight) hours as needed for muscle pain.    . polyethylene glycol (MIRALAX / GLYCOLAX) 17 g packet Take 17 g by mouth daily as  needed for moderate constipation. 14 each 0   No current facility-administered medications for this visit.    REVIEW OF SYSTEMS:   Constitutional: ( - ) fevers, ( - )  chills , ( - ) night sweats Eyes: ( - ) blurriness of vision, ( - ) double vision, ( - ) watery eyes Ears, nose, mouth, throat, and face: ( - ) mucositis, ( - ) sore throat Respiratory: ( - ) cough, ( - ) dyspnea, ( - ) wheezes Cardiovascular: ( - ) palpitation, ( - ) chest discomfort, ( - ) lower extremity swelling Gastrointestinal:  ( - ) nausea, ( - ) heartburn, ( - ) change in bowel habits Skin: ( - ) abnormal skin rashes Lymphatics: ( - ) new lymphadenopathy, ( - ) easy bruising Neurological: ( - ) numbness, ( - ) tingling, ( - ) new weaknesses Behavioral/Psych: ( - ) mood change, ( - ) new changes  All other systems were reviewed with the patient and are negative.  PHYSICAL EXAMINATION: ECOG PERFORMANCE STATUS: 3 - Symptomatic, >50% confined to bed  Vitals:   04/09/21 0914  BP: 119/77  Pulse: 75  Resp: 18  Temp: (!) 97.2 F (36.2 C)  SpO2: 100%   There were no vitals filed for this visit.  GENERAL: Chronically ill-appearing elderly African-American female in no acute distress, alert and comfortable SKIN: skin color, texture, turgor are normal, no rashes or significant lesions EYES: conjunctiva are pink and non-injected, sclera clear LUNGS: clear to auscultation and percussion with normal breathing effort HEART: regular rate & rhythm and no murmurs and no lower extremity edema Musculoskeletal: no cyanosis of digits and no clubbing  PSYCH: alert & oriented x 3, fluent speech NEURO: no focal motor/sensory deficits  LABORATORY DATA:  I have reviewed the data as listed CBC Latest Ref Rng & Units 04/09/2021 02/17/2021 02/09/2021  WBC 4.0 - 10.5 K/uL 10.5 12.9(H) 11.2(H)  Hemoglobin 12.0 - 15.0 g/dL 10.6(L) 9.9(L) 9.8(L)  Hematocrit 36.0 - 46.0 % 34.3(L) 30.8(L) 30.7(L)  Platelets 150 - 400 K/uL 265 178 259     CMP Latest Ref Rng & Units 04/09/2021 02/17/2021 02/10/2021  Glucose 70 - 99 mg/dL 93 136(H) -  BUN 8 - 23 mg/dL 10 31(H) -  Creatinine 0.44 - 1.00 mg/dL 0.63 0.69 0.48  Sodium 135 - 145 mmol/L 139 135 -  Potassium 3.5 - 5.1 mmol/L 4.2 4.2 -  Chloride 98 - 111 mmol/L 100 101 -  CO2 22 - 32 mmol/L 28 26 -  Calcium 8.9 - 10.3 mg/dL 8.9 8.0(L) -  Total Protein 6.5 - 8.1 g/dL 6.4(L) - -  Total Bilirubin 0.3 - 1.2 mg/dL 0.4 - -  Alkaline Phos 38 - 126 U/L 61 - -  AST 15 - 41 U/L 11(L) - -  ALT 0 - 44 U/L <6 - -  RADIOGRAPHIC STUDIES: ECHOCARDIOGRAM COMPLETE  Result Date: 03/26/2021    ECHOCARDIOGRAM REPORT   Patient Name:   Tammy Larsen Date of Exam: 03/26/2021 Medical Rec #:  [id number]    Height:       65.5 in Accession #:    [id number]   Weight:       163.1 lb Date of Birth:  11-01-1951    BSA:          1.824 m Patient Age:    27 years     BP:           124/75 mmHg Patient Gender: F            HR:           83 bpm. Exam Location:  Outpatient Procedure: 2D Echo, Cardiac Doppler and Color Doppler Indications:    Chemo Z09  History:        Patient has no prior history of Echocardiogram examinations.  Sonographer:    Bernadene Person RDCS Referring Phys: [id number] [place]  1. Poor acoustic windows limit study.  2. Global longitudinal strain is -20.9%. Left ventricular ejection fraction, by estimation, is 55 to 60%. The left ventricle has normal function.  3. Right ventricular systolic function is normal. The right ventricular size is normal.  4. Trivial mitral valve regurgitation.  5. The aortic valve is normal in structure. Aortic valve regurgitation is not visualized.  6. The inferior vena cava is normal in size with greater than 50% respiratory variability, suggesting right atrial pressure of 3 mmHg. FINDINGS  Left Ventricle: Global longitudinal strain is -20.9%. Left ventricular ejection fraction, by estimation, is 55 to 60%. The left ventricle has normal function. The left  ventricular internal cavity size was normal in size. There is no left ventricular hypertrophy. Right Ventricle: The right ventricular size is normal. Right vetricular wall thickness was not assessed. Right ventricular systolic function is normal. Left Atrium: Left atrial size was normal in size. Right Atrium: Right atrial size was normal in size. Pericardium: There is no evidence of pericardial effusion. Mitral Valve: There is mild thickening of the mitral valve leaflet(s). Mild mitral annular calcification. Trivial mitral valve regurgitation. Tricuspid Valve: The tricuspid valve is normal in structure. Tricuspid valve regurgitation is trivial. Aortic Valve: The aortic valve is normal in structure. Aortic valve regurgitation is not visualized. Pulmonic Valve: The pulmonic valve was not well visualized. Pulmonic valve regurgitation is not visualized. Aorta: The aortic root is normal in size and structure. Venous: The inferior vena cava is normal in size with greater than 50% respiratory variability, suggesting right atrial pressure of 3 mmHg. IAS/Shunts: No atrial level shunt detected by color flow Doppler.  LEFT VENTRICLE PLAX 2D LVIDd:         4.30 cm  Diastology LVIDs:         2.90 cm  LV e' medial:    6.85 cm/s LV PW:         0.70 cm  LV E/e' medial:  9.8 LV IVS:        0.70 cm  LV e' lateral:   8.38 cm/s LVOT diam:     1.90 cm  LV E/e' lateral: 8.0 LV SV:         48 LV SV Index:   27 LVOT Area:     2.84 cm  RIGHT VENTRICLE RV S prime:     15.40 cm/s TAPSE (M-mode): 1.7 cm LEFT ATRIUM  Index       RIGHT ATRIUM          Index LA diam:        2.50 cm 1.37 cm/m  RA Area:     9.11 cm LA Vol (A2C):   21.8 ml 11.95 ml/m RA Volume:   15.50 ml 8.50 ml/m LA Vol (A4C):   30.7 ml 16.83 ml/m LA Biplane Vol: 25.9 ml 14.20 ml/m  AORTIC VALVE LVOT Vmax:   93.10 cm/s LVOT Vmean:  65.000 cm/s LVOT VTI:    0.171 m  AORTA Ao Root diam: 2.90 cm Ao Asc diam:  2.60 cm MITRAL VALVE MV Area (PHT): 3.03 cm    SHUNTS MV  Decel Time: 250 msec    Systemic VTI:  0.17 m MV E velocity: 67.30 cm/s  Systemic Diam: 1.90 cm MV A velocity: 88.10 cm/s MV E/A ratio:  0.76 Dorris Carnes MD Electronically signed by Dorris Carnes MD Signature Date/Time: 03/26/2021/5:54:22 PM    Final     ASSESSMENT & PLAN Tammy Larsen 70 y.o. female with medical history significant for metastatic triple negative breast cancer who presents for a follow up visit.  After reviewed labs, the records, discussed with the patient the findings most consistent with widely metastatic triple negative breast cancer.  Unfortunately patient has poor functional status with an ECOG approximately 3.  She is only able to stand with two-person assist and is badly deconditioned.  She is currently in a skilled nursing facility and is working with physical therapy.  She does hope she can receive treatment if it is all available to her.  Previously we discussed the nature of metastatic disease and is incurable nature.  We noted that all treatments moving forward would be palliative in nature designed to shrink or slow down the growth of the tumor, but would not remove the cancer from her body.  Surgery and radiation are not options at this point given his widespread to the brain, lungs, bones from the breast.  The patient and her niece voiced understanding of her diagnosis.  At this time I think she is a poor candidate for treatment given her marked deconditioning.  I would strongly recommend that she continue to pursue physical therapy and build up her strength and continue to eat well.  If she were to have a change in functional status we could readdress and consider chemotherapy, however I am concerned that this may not be an option for her.  If the patient were to be well enough to consider systemic treatment we would consider monotherapy with either an anthracycline or taxane.  These drugs can be offered in combination, however they may increase response rate but do not improve  overall survival and increase toxicity.  Given her already frail status I think monotherapy of 1 of these agents would be the most appropriate next step if treatment were feasible.  # Metastatic Triple Negative Breast Cancer --Unfortunately the patient has markedly poor functional status with an ECOG of 3.  She is not able to stand without 2 person assist and is badly deconditioned.  I do not believe at this point time she is a candidate for chemotherapy, but I do encourage her to continue with physical therapy in order to improve her strength. -- echocardiogram performed, may help determine if anthracycline therapy may be available to her in the future. --Patient is status post palliative radiation to the brain. --We ordered additional tumor markers to see if there is a possible target  for therapy, however these have returned without a clear target or new therapy option.  Unfortunately she is triple negative and only chemotherapy options available to her at this time. --Patient notes that she would like to pursue treatment if at all possible, but appears understanding about her poor functional status and the risks of chemotherapy.  --Return to clinic in approximately 4 weeks time to reassess.  No orders of the defined types were placed in this encounter.   All questions were answered. The patient knows to call the clinic with any problems, questions or concerns.  A total of more than 30 minutes were spent on this encounter and over half of that time was spent on counseling and coordination of care as outlined above.   Ledell Peoples, MD Department of Hematology/Oncology [place] at [facility] Phone: [phone] Pager: [phone] Email: [email or web address]  04/13/2021 11:14 AM

## 2021-04-14 ENCOUNTER — Ambulatory Visit: Payer: PPO | Admitting: Hematology and Oncology

## 2021-04-14 ENCOUNTER — Other Ambulatory Visit: Payer: PPO

## 2021-04-23 DIAGNOSIS — S81802A Unspecified open wound, left lower leg, initial encounter: Secondary | ICD-10-CM | POA: Diagnosis not present

## 2021-04-23 DIAGNOSIS — L89620 Pressure ulcer of left heel, unstageable: Secondary | ICD-10-CM | POA: Diagnosis not present

## 2021-04-23 DIAGNOSIS — L853 Xerosis cutis: Secondary | ICD-10-CM | POA: Diagnosis not present

## 2021-04-28 DIAGNOSIS — N3946 Mixed incontinence: Secondary | ICD-10-CM | POA: Diagnosis not present

## 2021-04-28 DIAGNOSIS — L89626 Pressure-induced deep tissue damage of left heel: Secondary | ICD-10-CM | POA: Diagnosis not present

## 2021-04-28 DIAGNOSIS — M6281 Muscle weakness (generalized): Secondary | ICD-10-CM | POA: Diagnosis not present

## 2021-04-28 DIAGNOSIS — I87312 Chronic venous hypertension (idiopathic) with ulcer of left lower extremity: Secondary | ICD-10-CM | POA: Diagnosis not present

## 2021-04-30 ENCOUNTER — Inpatient Hospital Stay: Payer: PPO

## 2021-04-30 ENCOUNTER — Encounter: Payer: Self-pay | Admitting: Hematology and Oncology

## 2021-04-30 ENCOUNTER — Inpatient Hospital Stay: Payer: PPO | Attending: Hematology and Oncology | Admitting: Hematology and Oncology

## 2021-04-30 ENCOUNTER — Other Ambulatory Visit: Payer: PPO

## 2021-04-30 ENCOUNTER — Other Ambulatory Visit: Payer: Self-pay

## 2021-04-30 ENCOUNTER — Other Ambulatory Visit: Payer: Self-pay | Admitting: Hematology and Oncology

## 2021-04-30 VITALS — BP 118/74 | HR 84 | Temp 98.5°F | Resp 18 | Ht 65.0 in

## 2021-04-30 DIAGNOSIS — C7951 Secondary malignant neoplasm of bone: Secondary | ICD-10-CM | POA: Insufficient documentation

## 2021-04-30 DIAGNOSIS — C7931 Secondary malignant neoplasm of brain: Secondary | ICD-10-CM | POA: Diagnosis not present

## 2021-04-30 DIAGNOSIS — C50911 Malignant neoplasm of unspecified site of right female breast: Secondary | ICD-10-CM

## 2021-04-30 DIAGNOSIS — Z171 Estrogen receptor negative status [ER-]: Secondary | ICD-10-CM | POA: Insufficient documentation

## 2021-04-30 DIAGNOSIS — C7800 Secondary malignant neoplasm of unspecified lung: Secondary | ICD-10-CM | POA: Diagnosis not present

## 2021-04-30 DIAGNOSIS — Z7189 Other specified counseling: Secondary | ICD-10-CM

## 2021-04-30 LAB — CBC WITH DIFFERENTIAL (CANCER CENTER ONLY)
Abs Immature Granulocytes: 0.05 10*3/uL (ref 0.00–0.07)
Basophils Absolute: 0.1 10*3/uL (ref 0.0–0.1)
Basophils Relative: 1 %
Eosinophils Absolute: 0.1 10*3/uL (ref 0.0–0.5)
Eosinophils Relative: 2 %
HCT: 36.5 % (ref 36.0–46.0)
Hemoglobin: 11.1 g/dL — ABNORMAL LOW (ref 12.0–15.0)
Immature Granulocytes: 1 %
Lymphocytes Relative: 16 %
Lymphs Abs: 1.3 10*3/uL (ref 0.7–4.0)
MCH: 23 pg — ABNORMAL LOW (ref 26.0–34.0)
MCHC: 30.4 g/dL (ref 30.0–36.0)
MCV: 75.6 fL — ABNORMAL LOW (ref 80.0–100.0)
Monocytes Absolute: 0.5 10*3/uL (ref 0.1–1.0)
Monocytes Relative: 6 %
Neutro Abs: 6 10*3/uL (ref 1.7–7.7)
Neutrophils Relative %: 74 %
Platelet Count: 266 10*3/uL (ref 150–400)
RBC: 4.83 MIL/uL (ref 3.87–5.11)
RDW: 18.6 % — ABNORMAL HIGH (ref 11.5–15.5)
WBC Count: 8 10*3/uL (ref 4.0–10.5)
nRBC: 0 % (ref 0.0–0.2)

## 2021-04-30 LAB — CMP (CANCER CENTER ONLY)
ALT: <6 U/L (ref 0–44)
AST: 12 U/L — ABNORMAL LOW (ref 15–41)
Albumin: 3.4 g/dL — ABNORMAL LOW (ref 3.5–5.0)
Alkaline Phosphatase: 58 U/L (ref 38–126)
Anion gap: 10 (ref 5–15)
BUN: 11 mg/dL (ref 8–23)
CO2: 28 mmol/L (ref 22–32)
Calcium: 9.3 mg/dL (ref 8.9–10.3)
Chloride: 107 mmol/L (ref 98–111)
Creatinine: 0.7 mg/dL (ref 0.44–1.00)
GFR, Estimated: >60 mL/min (ref >60)
Glucose, Bld: 93 mg/dL (ref 70–99)
Potassium: 3.6 mmol/L (ref 3.5–5.1)
Sodium: 145 mmol/L (ref 135–145)
Total Bilirubin: 0.3 mg/dL (ref 0.3–1.2)
Total Protein: 6.7 g/dL (ref 6.5–8.1)

## 2021-04-30 NOTE — Progress Notes (Signed)
[place] Telephone:(336) [phone]   Fax:(336) [phone]  PROGRESS NOTE  Patient Care Team: Pcp, No as PCP - General  Hematological/Oncological History # Metastatic Triple Negative Breast Cancer 02/03/2021: presented to the ED with worsening weakness. CT Head WO showed bilateral frontal lobe masses. CT CAP showed pulmonary nodules, right breast mass, osseus metastasis in the ribs and spine. There was no evidence of disease in the abdomen/pelvis.  02/04/2021: establish with Dr. Lorenso Courier while inpatient 02/05/2021: breast mass biopsied, findings consistent with triple negative breast cancer.  2/15-2/25/2022: palliative radiation treatment to the brain  Interval History:  Tammy Larsen 70 y.o. female with medical history significant for metastatic triple negative breast cancer who presents for a follow up visit. The patient's last visit was on 04/09/2021. In the interim since the last visit Tammy Larsen has not had any performance status improvement.  On exam today Tammy Larsen reports that she "still feels good".  She reports she has been eating well and her facility including macaroni dishes and fried chicken.  Her weight is currently steady at 138 pounds.  Unfortunately she still has not been able to perform any physical therapy.  She is not having any issues with headaches, or vision changes.  Her breathing is good and she is not currently in any pain.   She denies any fevers, chills, sweats, nausea, vomiting or diarrhea.  A full 10 point ROS is listed below.   MEDICAL HISTORY:  Past Medical History:  Diagnosis Date  . Arthritis     SURGICAL HISTORY: No past surgical history on file.  SOCIAL HISTORY: Social History   Socioeconomic History  . Marital status: Single    Spouse name: Not on file  . Number of children: 0  . Years of education: Not on file  . Highest education level: Not on file  Occupational History  . Not on file  Tobacco Use  . Smoking status: Never  Smoker  . Smokeless tobacco: Never Used  Substance and Sexual Activity  . Alcohol use: Never  . Drug use: Not on file  . Sexual activity: Not on file  Other Topics Concern  . Not on file  Social History Narrative  . Not on file   Social Determinants of Health   Financial Resource Strain: Not on file  Food Insecurity: Not on file  Transportation Needs: Not on file  Physical Activity: Not on file  Stress: Not on file  Social Connections: Not on file  Intimate Partner Violence: Not on file    FAMILY HISTORY: Family History  Problem Relation Age of Onset  . Cancer Neg Hx     ALLERGIES:  is allergic to penicillins.  MEDICATIONS:  Current Outpatient Medications  Medication Sig Dispense Refill  . acetaminophen (TYLENOL) 325 MG tablet Take 2 tablets (650 mg total) by mouth every 6 (six) hours as needed for mild pain.    . calcium carbonate (OS-CAL - DOSED IN MG OF ELEMENTAL CALCIUM) 1250 (500 Ca) MG tablet Take 1 tablet by mouth daily with breakfast.    . cholecalciferol (VITAMIN D3) 25 MCG (1000 UNIT) tablet Take 1,000 Units by mouth daily.    Marland Kitchen dexamethasone (DECADRON) 2 MG tablet Take 2 tablets three times per day for three days, then 2 tablets twice daily for seven days, then 2 tables daily for 7 days, then 1 tablet daily for 7 days, then 1 tablet every other day for 10 days. 100 tablet 0  . fexofenadine (ALLEGRA) 180 MG tablet Take  180 mg by mouth daily as needed for allergies or rhinitis.    . Ginkgo Biloba 120 MG CAPS Take 120 mg by mouth daily.    Marland Kitchen levETIRAcetam (KEPPRA) 500 MG tablet Take 1 tablet (500 mg total) by mouth 2 (two) times daily. 60 tablet 0  . methocarbamol (ROBAXIN) 500 MG tablet Take 500 mg by mouth every 8 (eight) hours as needed for muscle pain.    . polyethylene glycol (MIRALAX / GLYCOLAX) 17 g packet Take 17 g by mouth daily as needed for moderate constipation. 14 each 0   No current facility-administered medications for this visit.    REVIEW OF  SYSTEMS:   Constitutional: ( - ) fevers, ( - )  chills , ( - ) night sweats Eyes: ( - ) blurriness of vision, ( - ) double vision, ( - ) watery eyes Ears, nose, mouth, throat, and face: ( - ) mucositis, ( - ) sore throat Respiratory: ( - ) cough, ( - ) dyspnea, ( - ) wheezes Cardiovascular: ( - ) palpitation, ( - ) chest discomfort, ( - ) lower extremity swelling Gastrointestinal:  ( - ) nausea, ( - ) heartburn, ( - ) change in bowel habits Skin: ( - ) abnormal skin rashes Lymphatics: ( - ) new lymphadenopathy, ( - ) easy bruising Neurological: ( - ) numbness, ( - ) tingling, ( - ) new weaknesses Behavioral/Psych: ( - ) mood change, ( - ) new changes  All other systems were reviewed with the patient and are negative.  PHYSICAL EXAMINATION: ECOG PERFORMANCE STATUS: 3 - Symptomatic, >50% confined to bed  Vitals:   04/30/21 1116  BP: 118/74  Pulse: 84  Resp: 18  Temp: 98.5 F (36.9 C)  SpO2: 100%   Filed Weights    GENERAL: Chronically ill-appearing elderly African-American female in no acute distress, alert and comfortable SKIN: skin color, texture, turgor are normal, no rashes or significant lesions EYES: conjunctiva are pink and non-injected, sclera clear LUNGS: clear to auscultation and percussion with normal breathing effort HEART: regular rate & rhythm and no murmurs and no lower extremity edema Musculoskeletal: no cyanosis of digits and no clubbing  PSYCH: alert & oriented x 3, fluent speech NEURO: no focal motor/sensory deficits  LABORATORY DATA:  I have reviewed the data as listed CBC Latest Ref Rng & Units 04/30/2021 04/09/2021 02/17/2021  WBC 4.0 - 10.5 K/uL 8.0 10.5 12.9(H)  Hemoglobin 12.0 - 15.0 g/dL 11.1(L) 10.6(L) 9.9(L)  Hematocrit 36.0 - 46.0 % 36.5 34.3(L) 30.8(L)  Platelets 150 - 400 K/uL 266 265 178    CMP Latest Ref Rng & Units 04/09/2021 02/17/2021 02/10/2021  Glucose 70 - 99 mg/dL 93 136(H) -  BUN 8 - 23 mg/dL 10 31(H) -  Creatinine 0.44 - 1.00 mg/dL  0.63 0.69 0.48  Sodium 135 - 145 mmol/L 139 135 -  Potassium 3.5 - 5.1 mmol/L 4.2 4.2 -  Chloride 98 - 111 mmol/L 100 101 -  CO2 22 - 32 mmol/L 28 26 -  Calcium 8.9 - 10.3 mg/dL 8.9 8.0(L) -  Total Protein 6.5 - 8.1 g/dL 6.4(L) - -  Total Bilirubin 0.3 - 1.2 mg/dL 0.4 - -  Alkaline Phos 38 - 126 U/L 61 - -  AST 15 - 41 U/L 11(L) - -  ALT 0 - 44 U/L <6 - -    RADIOGRAPHIC STUDIES: No results found.  ASSESSMENT & PLAN Tammy Larsen 70 y.o. female with medical history significant for metastatic triple negative  breast cancer who presents for a follow up visit.  After reviewed labs, the records, discussed with the patient the findings most consistent with widely metastatic triple negative breast cancer.  Unfortunately patient has poor functional status with an ECOG approximately 3.  She is only able to stand with two-person assist and is badly deconditioned.  She is currently in a skilled nursing facility and is working with physical therapy.  She does hope she can receive treatment if it is all available to her.  Previously we discussed the nature of metastatic disease and is incurable nature.  We noted that all treatments moving forward would be palliative in nature designed to shrink or slow down the growth of the tumor, but would not remove the cancer from her body.  Surgery and radiation are not options at this point given his widespread to the brain, lungs, bones from the breast.  The patient and her niece voiced understanding of her diagnosis.  At this time I think she is a poor candidate for treatment given her marked deconditioning.  I would strongly recommend that she continue to pursue physical therapy and build up her strength and continue to eat well.  If she were to have a change in functional status we could readdress and consider chemotherapy, however I am concerned that this may not be an option for her.  If the patient were to be well enough to consider systemic treatment we  would consider monotherapy with either an anthracycline or taxane.  These drugs can be offered in combination, however they may increase response rate but do not improve overall survival and increase toxicity.  Given her already frail status I think monotherapy of 1 of these agents would be the most appropriate next step if treatment were feasible.  # Metastatic Triple Negative Breast Cancer --Unfortunately the patient has markedly poor functional status with an ECOG of 3.  She is not able to stand without 2 person assist and is badly deconditioned.  I do not believe at this point time she is a candidate for chemotherapy, but I do encourage her to continue with physical therapy in order to improve her strength. -- echocardiogram performed, may help determine if anthracycline therapy may be available to her in the future. --Patient is status post palliative radiation to the brain. --We ordered additional tumor markers to see if there is a possible target for therapy, however these have returned without a clear target or new therapy option.  Unfortunately she is triple negative and only chemotherapy options available to her at this time. --Patient notes that she would like to pursue treatment if at all possible, but appears understanding about her poor functional status and the risks of chemotherapy.  --Return to clinic in approximately 6 weeks time to reassess.  No orders of the defined types were placed in this encounter.   All questions were answered. The patient knows to call the clinic with any problems, questions or concerns.  A total of more than 30 minutes were spent on this encounter and over half of that time was spent on counseling and coordination of care as outlined above.   Ledell Peoples, MD Department of Hematology/Oncology [place] at [facility] Phone: [phone] Pager: [phone] Email: [email or web address]  04/30/2021 11:33 AM

## 2021-05-03 ENCOUNTER — Encounter: Payer: Self-pay | Admitting: Hematology and Oncology

## 2021-05-03 ENCOUNTER — Telehealth: Payer: Self-pay | Admitting: Hematology and Oncology

## 2021-05-03 NOTE — Telephone Encounter (Signed)
Scheduled per los. Called and left msg. Mailed printout  °

## 2021-05-05 DIAGNOSIS — I87312 Chronic venous hypertension (idiopathic) with ulcer of left lower extremity: Secondary | ICD-10-CM | POA: Diagnosis not present

## 2021-05-05 DIAGNOSIS — L89623 Pressure ulcer of left heel, stage 3: Secondary | ICD-10-CM | POA: Diagnosis not present

## 2021-05-05 DIAGNOSIS — M6281 Muscle weakness (generalized): Secondary | ICD-10-CM | POA: Diagnosis not present

## 2021-05-05 DIAGNOSIS — N3946 Mixed incontinence: Secondary | ICD-10-CM | POA: Diagnosis not present

## 2021-05-08 DIAGNOSIS — M6281 Muscle weakness (generalized): Secondary | ICD-10-CM | POA: Diagnosis not present

## 2021-05-08 DIAGNOSIS — N3946 Mixed incontinence: Secondary | ICD-10-CM | POA: Diagnosis not present

## 2021-05-08 DIAGNOSIS — I87312 Chronic venous hypertension (idiopathic) with ulcer of left lower extremity: Secondary | ICD-10-CM | POA: Diagnosis not present

## 2021-05-08 DIAGNOSIS — L89623 Pressure ulcer of left heel, stage 3: Secondary | ICD-10-CM | POA: Diagnosis not present

## 2021-05-12 DIAGNOSIS — N3946 Mixed incontinence: Secondary | ICD-10-CM | POA: Diagnosis not present

## 2021-05-12 DIAGNOSIS — M6281 Muscle weakness (generalized): Secondary | ICD-10-CM | POA: Diagnosis not present

## 2021-05-12 DIAGNOSIS — I87312 Chronic venous hypertension (idiopathic) with ulcer of left lower extremity: Secondary | ICD-10-CM | POA: Diagnosis not present

## 2021-05-12 DIAGNOSIS — L89623 Pressure ulcer of left heel, stage 3: Secondary | ICD-10-CM | POA: Diagnosis not present

## 2021-05-19 DIAGNOSIS — N3946 Mixed incontinence: Secondary | ICD-10-CM | POA: Diagnosis not present

## 2021-05-19 DIAGNOSIS — L89623 Pressure ulcer of left heel, stage 3: Secondary | ICD-10-CM | POA: Diagnosis not present

## 2021-05-19 DIAGNOSIS — M6281 Muscle weakness (generalized): Secondary | ICD-10-CM | POA: Diagnosis not present

## 2021-05-19 DIAGNOSIS — I87312 Chronic venous hypertension (idiopathic) with ulcer of left lower extremity: Secondary | ICD-10-CM | POA: Diagnosis not present

## 2021-05-26 DIAGNOSIS — L89623 Pressure ulcer of left heel, stage 3: Secondary | ICD-10-CM | POA: Diagnosis not present

## 2021-05-26 DIAGNOSIS — M6281 Muscle weakness (generalized): Secondary | ICD-10-CM | POA: Diagnosis not present

## 2021-05-26 DIAGNOSIS — I87312 Chronic venous hypertension (idiopathic) with ulcer of left lower extremity: Secondary | ICD-10-CM | POA: Diagnosis not present

## 2021-05-26 DIAGNOSIS — N3946 Mixed incontinence: Secondary | ICD-10-CM | POA: Diagnosis not present

## 2021-05-27 DIAGNOSIS — L602 Onychogryphosis: Secondary | ICD-10-CM | POA: Diagnosis not present

## 2021-05-27 DIAGNOSIS — L853 Xerosis cutis: Secondary | ICD-10-CM | POA: Diagnosis not present

## 2021-05-27 DIAGNOSIS — L89621 Pressure ulcer of left heel, stage 1: Secondary | ICD-10-CM | POA: Diagnosis not present

## 2021-05-27 DIAGNOSIS — I739 Peripheral vascular disease, unspecified: Secondary | ICD-10-CM | POA: Diagnosis not present

## 2021-06-02 DIAGNOSIS — N3946 Mixed incontinence: Secondary | ICD-10-CM | POA: Diagnosis not present

## 2021-06-02 DIAGNOSIS — I87312 Chronic venous hypertension (idiopathic) with ulcer of left lower extremity: Secondary | ICD-10-CM | POA: Diagnosis not present

## 2021-06-02 DIAGNOSIS — M6281 Muscle weakness (generalized): Secondary | ICD-10-CM | POA: Diagnosis not present

## 2021-06-02 DIAGNOSIS — L89623 Pressure ulcer of left heel, stage 3: Secondary | ICD-10-CM | POA: Diagnosis not present

## 2021-06-07 ENCOUNTER — Telehealth: Payer: Self-pay | Admitting: *Deleted

## 2021-06-07 DIAGNOSIS — N390 Urinary tract infection, site not specified: Secondary | ICD-10-CM | POA: Diagnosis not present

## 2021-06-07 NOTE — Telephone Encounter (Signed)
Niece, Amber left a message stating Cigi has been experiencing some changes in mental status. They have asked facility to test urine to see if she has a UTI. She wants to know if she can get in sooner than Friday to see Dr Lorenso Courier just in case it is related to the lesions.

## 2021-06-08 ENCOUNTER — Telehealth: Payer: Self-pay | Admitting: Hematology and Oncology

## 2021-06-08 NOTE — Telephone Encounter (Signed)
Scheduled appointment per 06/14 sh msg. Patient is aware.

## 2021-06-09 DIAGNOSIS — I87312 Chronic venous hypertension (idiopathic) with ulcer of left lower extremity: Secondary | ICD-10-CM | POA: Diagnosis not present

## 2021-06-09 DIAGNOSIS — L89620 Pressure ulcer of left heel, unstageable: Secondary | ICD-10-CM | POA: Diagnosis not present

## 2021-06-09 DIAGNOSIS — I739 Peripheral vascular disease, unspecified: Secondary | ICD-10-CM | POA: Diagnosis not present

## 2021-06-10 DIAGNOSIS — N390 Urinary tract infection, site not specified: Secondary | ICD-10-CM | POA: Diagnosis not present

## 2021-06-10 DIAGNOSIS — C50919 Malignant neoplasm of unspecified site of unspecified female breast: Secondary | ICD-10-CM | POA: Diagnosis not present

## 2021-06-10 DIAGNOSIS — C7931 Secondary malignant neoplasm of brain: Secondary | ICD-10-CM | POA: Diagnosis not present

## 2021-06-10 DIAGNOSIS — R410 Disorientation, unspecified: Secondary | ICD-10-CM | POA: Diagnosis not present

## 2021-06-11 ENCOUNTER — Other Ambulatory Visit: Payer: PPO

## 2021-06-11 ENCOUNTER — Ambulatory Visit: Payer: PPO | Admitting: Hematology and Oncology

## 2021-06-14 ENCOUNTER — Other Ambulatory Visit: Payer: PPO

## 2021-06-14 ENCOUNTER — Ambulatory Visit: Payer: PPO | Admitting: Hematology and Oncology

## 2021-06-16 DIAGNOSIS — L89620 Pressure ulcer of left heel, unstageable: Secondary | ICD-10-CM | POA: Diagnosis not present

## 2021-06-16 DIAGNOSIS — I739 Peripheral vascular disease, unspecified: Secondary | ICD-10-CM | POA: Diagnosis not present

## 2021-06-17 ENCOUNTER — Inpatient Hospital Stay: Payer: PPO | Attending: Hematology and Oncology

## 2021-06-17 ENCOUNTER — Inpatient Hospital Stay (HOSPITAL_BASED_OUTPATIENT_CLINIC_OR_DEPARTMENT_OTHER): Payer: PPO | Admitting: Hematology and Oncology

## 2021-06-17 ENCOUNTER — Other Ambulatory Visit: Payer: Self-pay | Admitting: Hematology and Oncology

## 2021-06-17 ENCOUNTER — Other Ambulatory Visit: Payer: Self-pay

## 2021-06-17 VITALS — BP 117/71 | HR 88 | Temp 98.2°F | Resp 18 | Ht 68.0 in

## 2021-06-17 DIAGNOSIS — C7800 Secondary malignant neoplasm of unspecified lung: Secondary | ICD-10-CM | POA: Insufficient documentation

## 2021-06-17 DIAGNOSIS — C50919 Malignant neoplasm of unspecified site of unspecified female breast: Secondary | ICD-10-CM | POA: Insufficient documentation

## 2021-06-17 DIAGNOSIS — C7951 Secondary malignant neoplasm of bone: Secondary | ICD-10-CM | POA: Insufficient documentation

## 2021-06-17 DIAGNOSIS — C50911 Malignant neoplasm of unspecified site of right female breast: Secondary | ICD-10-CM

## 2021-06-17 DIAGNOSIS — Z171 Estrogen receptor negative status [ER-]: Secondary | ICD-10-CM | POA: Insufficient documentation

## 2021-06-17 DIAGNOSIS — C7931 Secondary malignant neoplasm of brain: Secondary | ICD-10-CM

## 2021-06-17 LAB — CBC WITH DIFFERENTIAL (CANCER CENTER ONLY)
Abs Immature Granulocytes: 0.02 10*3/uL (ref 0.00–0.07)
Basophils Absolute: 0 10*3/uL (ref 0.0–0.1)
Basophils Relative: 0 %
Eosinophils Absolute: 0.1 10*3/uL (ref 0.0–0.5)
Eosinophils Relative: 2 %
HCT: 35.9 % — ABNORMAL LOW (ref 36.0–46.0)
Hemoglobin: 11.2 g/dL — ABNORMAL LOW (ref 12.0–15.0)
Immature Granulocytes: 0 %
Lymphocytes Relative: 20 %
Lymphs Abs: 1.3 10*3/uL (ref 0.7–4.0)
MCH: 22.8 pg — ABNORMAL LOW (ref 26.0–34.0)
MCHC: 31.2 g/dL (ref 30.0–36.0)
MCV: 73 fL — ABNORMAL LOW (ref 80.0–100.0)
Monocytes Absolute: 0.5 10*3/uL (ref 0.1–1.0)
Monocytes Relative: 7 %
Neutro Abs: 4.6 10*3/uL (ref 1.7–7.7)
Neutrophils Relative %: 71 %
Platelet Count: 334 10*3/uL (ref 150–400)
RBC: 4.92 MIL/uL (ref 3.87–5.11)
RDW: 16.3 % — ABNORMAL HIGH (ref 11.5–15.5)
WBC Count: 6.5 10*3/uL (ref 4.0–10.5)
nRBC: 0 % (ref 0.0–0.2)

## 2021-06-17 LAB — CMP (CANCER CENTER ONLY)
ALT: 9 U/L (ref 0–44)
AST: 16 U/L (ref 15–41)
Albumin: 3.6 g/dL (ref 3.5–5.0)
Alkaline Phosphatase: 83 U/L (ref 38–126)
Anion gap: 9 (ref 5–15)
BUN: 15 mg/dL (ref 8–23)
CO2: 27 mmol/L (ref 22–32)
Calcium: 10.1 mg/dL (ref 8.9–10.3)
Chloride: 106 mmol/L (ref 98–111)
Creatinine: 0.62 mg/dL (ref 0.44–1.00)
GFR, Estimated: >60 mL/min (ref >60)
Glucose, Bld: 99 mg/dL (ref 70–99)
Potassium: 4 mmol/L (ref 3.5–5.1)
Sodium: 142 mmol/L (ref 135–145)
Total Bilirubin: 0.3 mg/dL (ref 0.3–1.2)
Total Protein: 6.9 g/dL (ref 6.5–8.1)

## 2021-06-17 NOTE — Progress Notes (Signed)
[place] [phone]   [phone]  PROGRESS NOTE  Patient Care Team: Pcp, No as PCP - General  Hematological/Oncological History # Metastatic Triple Negative Breast Cancer 02/03/2021: presented to the ED with worsening weakness. CT Head WO showed bilateral frontal lobe masses. CT CAP showed pulmonary nodules, right breast mass, osseus metastasis in the ribs and spine. There was no evidence of disease in the abdomen/pelvis.  02/04/2021: establish with Dr. Lorenso Courier while inpatient 02/05/2021: breast mass biopsied, findings consistent with triple negative breast cancer.  2/15-2/25/2022: palliative radiation treatment to the brain  Interval History:  Tammy Larsen 70 y.o. female with medical history significant for metastatic triple negative breast cancer who presents for a follow up visit. The patient's last visit was on 04/30/2021. In the interim since the last visit Tammy Larsen has not had any performance status improvement.  On exam today Mrs. Moyd reports she is recovered well from her bout of altered mental status.  He was found to be urinary tract infection and she completed a antibiotic course of Macrobid.  She reports that her appetite has been good but unfortunately she continues to be bedbound.  She has not had any real physical therapy performed to help improve her strength.  Her breathing is good and she is not currently in any pain.   She denies any fevers, chills, sweats, nausea, vomiting or diarrhea.  A full 10 point ROS is listed below.  MEDICAL HISTORY:  Past Medical History:  Diagnosis Date   Arthritis     SURGICAL HISTORY: No past surgical history on file.  SOCIAL HISTORY: Social History   Socioeconomic History   Marital status: Single    Spouse name: Not on file   Number of children: 0   Years of education: Not on file   Highest education level: Not on file  Occupational History   Not on file  Tobacco Use   Smoking status:  Never   Smokeless tobacco: Never  Substance and Sexual Activity   Alcohol use: Never   Drug use: Not on file   Sexual activity: Not on file  Other Topics Concern   Not on file  Social History Narrative   Not on file   Social Determinants of Health   Financial Resource Strain: Not on file  Food Insecurity: Not on file  Transportation Needs: Not on file  Physical Activity: Not on file  Stress: Not on file  Social Connections: Not on file  Intimate Partner Violence: Not on file    FAMILY HISTORY: Family History  Problem Relation Age of Onset   Cancer Neg Hx     ALLERGIES:  is allergic to penicillins.  MEDICATIONS:  Current Outpatient Medications  Medication Sig Dispense Refill   acetaminophen (TYLENOL) 325 MG tablet Take 2 tablets (650 mg total) by mouth every 6 (six) hours as needed for mild pain.     calcium carbonate (OS-CAL - DOSED IN MG OF ELEMENTAL CALCIUM) 1250 (500 Ca) MG tablet Take 1 tablet by mouth daily with breakfast.     cholecalciferol (VITAMIN D3) 25 MCG (1000 UNIT) tablet Take 1,000 Units by mouth daily.     fexofenadine (ALLEGRA) 180 MG tablet Take 180 mg by mouth daily as needed for allergies or rhinitis.     Ginkgo Biloba 120 MG CAPS Take 120 mg by mouth daily.     levETIRAcetam (KEPPRA) 500 MG tablet Take 1 tablet (500 mg total) by mouth 2 (two) times daily. 60 tablet 0  methocarbamol (ROBAXIN) 500 MG tablet Take 500 mg by mouth every 8 (eight) hours as needed for muscle pain.     nitrofurantoin, macrocrystal-monohydrate, (MACROBID) 100 MG capsule Take 100 mg by mouth 2 (two) times daily.     NYAMYC powder Apply topically.     ondansetron (ZOFRAN) 4 MG tablet Take by mouth.     polyethylene glycol (MIRALAX / GLYCOLAX) 17 g packet Take 17 g by mouth daily as needed for moderate constipation. 14 each 0   No current facility-administered medications for this visit.    REVIEW OF SYSTEMS:   Constitutional: ( - ) fevers, ( - )  chills , ( - ) night  sweats Eyes: ( - ) blurriness of vision, ( - ) double vision, ( - ) watery eyes Ears, nose, mouth, throat, and face: ( - ) mucositis, ( - ) sore throat Respiratory: ( - ) cough, ( - ) dyspnea, ( - ) wheezes Cardiovascular: ( - ) palpitation, ( - ) chest discomfort, ( - ) lower extremity swelling Gastrointestinal:  ( - ) nausea, ( - ) heartburn, ( - ) change in bowel habits Skin: ( - ) abnormal skin rashes Lymphatics: ( - ) new lymphadenopathy, ( - ) easy bruising Neurological: ( - ) numbness, ( - ) tingling, ( - ) new weaknesses Behavioral/Psych: ( - ) mood change, ( - ) new changes  All other systems were reviewed with the patient and are negative.  PHYSICAL EXAMINATION: ECOG PERFORMANCE STATUS: 3 - Symptomatic, >50% confined to bed  Vitals:   06/17/21 1551  BP: 117/71  Pulse: 88  Resp: 18  Temp: 98.2 F (36.8 C)  SpO2: 100%   There were no vitals filed for this visit.   GENERAL: Chronically ill-appearing elderly African-American female in no acute distress, alert and comfortable SKIN: skin color, texture, turgor are normal, no rashes or significant lesions EYES: conjunctiva are pink and non-injected, sclera clear LUNGS: clear to auscultation and percussion with normal breathing effort HEART: regular rate & rhythm and no murmurs and no lower extremity edema Musculoskeletal: no cyanosis of digits and no clubbing  PSYCH: alert & oriented x 3, fluent speech NEURO: no focal motor/sensory deficits  LABORATORY DATA:  I have reviewed the data as listed CBC Latest Ref Rng & Units 06/17/2021 04/30/2021 04/09/2021  WBC 4.0 - 10.5 K/uL 6.5 8.0 10.5  Hemoglobin 12.0 - 15.0 g/dL 11.2(L) 11.1(L) 10.6(L)  Hematocrit 36.0 - 46.0 % 35.9(L) 36.5 34.3(L)  Platelets 150 - 400 K/uL 334 266 265    CMP Latest Ref Rng & Units 06/17/2021 04/30/2021 04/09/2021  Glucose 70 - 99 mg/dL 99 93 93  BUN 8 - 23 mg/dL 15 11 10   Creatinine 0.44 - 1.00 mg/dL 0.62 0.70 0.63  Sodium 135 - 145 mmol/L 142 145 139   Potassium 3.5 - 5.1 mmol/L 4.0 3.6 4.2  Chloride 98 - 111 mmol/L 106 107 100  CO2 22 - 32 mmol/L 27 28 28   Calcium 8.9 - 10.3 mg/dL 10.1 9.3 8.9  Total Protein 6.5 - 8.1 g/dL 6.9 6.7 6.4(L)  Total Bilirubin 0.3 - 1.2 mg/dL 0.3 0.3 0.4  Alkaline Phos 38 - 126 U/L 83 58 61  AST 15 - 41 U/L 16 12(L) 11(L)  ALT 0 - 44 U/L 9 <6 <6    RADIOGRAPHIC STUDIES: No results found.  ASSESSMENT & PLAN Tammy Larsen 70 y.o. female with medical history significant for metastatic triple negative breast cancer who presents for a follow up  visit.  After reviewed labs, the records, discussed with the patient the findings most consistent with widely metastatic triple negative breast cancer.  Unfortunately patient has poor functional status with an ECOG approximately 3.  She is only able to stand with two-person assist and is badly deconditioned.  She is currently in a skilled nursing facility and is working with physical therapy.  She does hope she can receive treatment if it is all available to her.  Previously we discussed the nature of metastatic disease and is incurable nature.  We noted that all treatments moving forward would be palliative in nature designed to shrink or slow down the growth of the tumor, but would not remove the cancer from her body.  Surgery and radiation are not options at this point given his widespread to the brain, lungs, bones from the breast.  The patient and her niece voiced understanding of her diagnosis.  At this time I think she is a poor candidate for treatment given her marked deconditioning.  I would strongly recommend that she continue to pursue physical therapy and build up her strength and continue to eat well.  If she were to have a change in functional status we could readdress and consider chemotherapy, however I am concerned that this may not be an option for her.  If the patient were to be well enough to consider systemic treatment we would consider monotherapy with  either an anthracycline or taxane.  These drugs can be offered in combination, however they may increase response rate but do not improve overall survival and increase toxicity.  Given her already frail status I think monotherapy of 1 of these agents would be the most appropriate next step if treatment were feasible.  # Metastatic Triple Negative Breast Cancer --Unfortunately the patient has markedly poor functional status with an ECOG of 3.  She is not able to stand without 2 person assist and is badly deconditioned.  I do not believe at this point time she is a candidate for chemotherapy, but I do encourage her to continue with physical therapy in order to improve her strength. -- echocardiogram performed, may help determine if anthracycline therapy may be available to her in the future. --Patient is status post palliative radiation to the brain. --We ordered additional tumor markers to see if there is a possible target for therapy, however these have returned without a clear target or new therapy option.  Unfortunately she is triple negative and only chemotherapy options available to her at this time. --Patient notes that she would like to pursue treatment if at all possible, but appears understanding about her poor functional status and the risks of chemotherapy.  --Return to clinic in approximately 6 weeks time to reassess.  No orders of the defined types were placed in this encounter.  All questions were answered. The patient knows to call the clinic with any problems, questions or concerns.  A total of more than 30 minutes were spent on this encounter and over half of that time was spent on counseling and coordination of care as outlined above.   Ledell Peoples, MD Department of Hematology/Oncology [place] at [facility] Phone: [phone] Pager: [phone] Email: [email or web address]  06/17/2021 4:21 PM

## 2021-06-23 DIAGNOSIS — I739 Peripheral vascular disease, unspecified: Secondary | ICD-10-CM | POA: Diagnosis not present

## 2021-06-23 DIAGNOSIS — L89620 Pressure ulcer of left heel, unstageable: Secondary | ICD-10-CM | POA: Diagnosis not present

## 2021-06-29 ENCOUNTER — Telehealth: Payer: Self-pay | Admitting: Hematology and Oncology

## 2021-06-29 NOTE — Telephone Encounter (Signed)
Scheduled appts per 7/3 sch msg. Called pt, no answer. Left msg with appts date and times.

## 2021-07-29 ENCOUNTER — Other Ambulatory Visit: Payer: Self-pay | Admitting: Hematology and Oncology

## 2021-07-29 ENCOUNTER — Inpatient Hospital Stay: Payer: Medicare Other | Attending: Hematology and Oncology

## 2021-07-29 ENCOUNTER — Inpatient Hospital Stay: Payer: Medicare Other | Admitting: Hematology and Oncology

## 2021-07-29 DIAGNOSIS — C7931 Secondary malignant neoplasm of brain: Secondary | ICD-10-CM

## 2021-07-29 DIAGNOSIS — C50911 Malignant neoplasm of unspecified site of right female breast: Secondary | ICD-10-CM | POA: Insufficient documentation

## 2021-07-29 DIAGNOSIS — Z171 Estrogen receptor negative status [ER-]: Secondary | ICD-10-CM | POA: Insufficient documentation

## 2021-07-29 DIAGNOSIS — M25551 Pain in right hip: Secondary | ICD-10-CM | POA: Insufficient documentation

## 2021-07-29 DIAGNOSIS — C7951 Secondary malignant neoplasm of bone: Secondary | ICD-10-CM | POA: Insufficient documentation

## 2021-08-06 ENCOUNTER — Inpatient Hospital Stay: Payer: Medicare Other

## 2021-08-06 ENCOUNTER — Inpatient Hospital Stay: Payer: Medicare Other | Admitting: Hematology and Oncology

## 2021-08-19 ENCOUNTER — Other Ambulatory Visit: Payer: Self-pay

## 2021-08-19 ENCOUNTER — Inpatient Hospital Stay (HOSPITAL_BASED_OUTPATIENT_CLINIC_OR_DEPARTMENT_OTHER): Payer: Medicare Other | Admitting: Hematology and Oncology

## 2021-08-19 ENCOUNTER — Inpatient Hospital Stay: Payer: Medicare Other

## 2021-08-19 VITALS — BP 93/62 | HR 97 | Temp 96.3°F | Resp 17

## 2021-08-19 DIAGNOSIS — C7931 Secondary malignant neoplasm of brain: Secondary | ICD-10-CM

## 2021-08-19 DIAGNOSIS — C50911 Malignant neoplasm of unspecified site of right female breast: Secondary | ICD-10-CM | POA: Diagnosis not present

## 2021-08-19 DIAGNOSIS — M25551 Pain in right hip: Secondary | ICD-10-CM | POA: Diagnosis not present

## 2021-08-19 DIAGNOSIS — C7951 Secondary malignant neoplasm of bone: Secondary | ICD-10-CM | POA: Diagnosis not present

## 2021-08-19 DIAGNOSIS — Z171 Estrogen receptor negative status [ER-]: Secondary | ICD-10-CM | POA: Diagnosis not present

## 2021-08-19 LAB — CBC WITH DIFFERENTIAL (CANCER CENTER ONLY)
Abs Immature Granulocytes: 0.01 10*3/uL (ref 0.00–0.07)
Basophils Absolute: 0 10*3/uL (ref 0.0–0.1)
Basophils Relative: 1 %
Eosinophils Absolute: 0.1 10*3/uL (ref 0.0–0.5)
Eosinophils Relative: 1 %
HCT: 35.3 % — ABNORMAL LOW (ref 36.0–46.0)
Hemoglobin: 10.9 g/dL — ABNORMAL LOW (ref 12.0–15.0)
Immature Granulocytes: 0 %
Lymphocytes Relative: 34 %
Lymphs Abs: 2.8 10*3/uL (ref 0.7–4.0)
MCH: 22.2 pg — ABNORMAL LOW (ref 26.0–34.0)
MCHC: 30.9 g/dL (ref 30.0–36.0)
MCV: 71.9 fL — ABNORMAL LOW (ref 80.0–100.0)
Monocytes Absolute: 0.5 10*3/uL (ref 0.1–1.0)
Monocytes Relative: 6 %
Neutro Abs: 4.9 10*3/uL (ref 1.7–7.7)
Neutrophils Relative %: 58 %
Platelet Count: 420 10*3/uL — ABNORMAL HIGH (ref 150–400)
RBC: 4.91 MIL/uL (ref 3.87–5.11)
RDW: 19 % — ABNORMAL HIGH (ref 11.5–15.5)
WBC Count: 8.4 10*3/uL (ref 4.0–10.5)
nRBC: 0 % (ref 0.0–0.2)

## 2021-08-19 LAB — CMP (CANCER CENTER ONLY)
ALT: 11 U/L (ref 0–44)
AST: 18 U/L (ref 15–41)
Albumin: 3.5 g/dL (ref 3.5–5.0)
Alkaline Phosphatase: 83 U/L (ref 38–126)
Anion gap: 10 (ref 5–15)
BUN: 13 mg/dL (ref 8–23)
CO2: 25 mmol/L (ref 22–32)
Calcium: 9.6 mg/dL (ref 8.9–10.3)
Chloride: 107 mmol/L (ref 98–111)
Creatinine: 0.68 mg/dL (ref 0.44–1.00)
GFR, Estimated: >60 mL/min (ref >60)
Glucose, Bld: 100 mg/dL — ABNORMAL HIGH (ref 70–99)
Potassium: 3.4 mmol/L — ABNORMAL LOW (ref 3.5–5.1)
Sodium: 142 mmol/L (ref 135–145)
Total Bilirubin: 0.4 mg/dL (ref 0.3–1.2)
Total Protein: 6.9 g/dL (ref 6.5–8.1)

## 2021-08-19 MED ORDER — ACETAMINOPHEN 325 MG PO TABS
650.0000 mg | ORAL_TABLET | Freq: Once | ORAL | Status: AC
  Administered 2021-08-19: 650 mg via ORAL

## 2021-08-19 NOTE — Progress Notes (Signed)
Hospice referral called in to [facility]. They are to contact niece , Hoyle Barr for admission visit

## 2021-08-19 NOTE — Progress Notes (Signed)
[place] [phone]   Fax:(336) [phone]  PROGRESS NOTE  Patient Care Team: Pcp, No as PCP - General  Hematological/Oncological History # Metastatic Triple Negative Breast Cancer 02/03/2021: presented to the ED with worsening weakness. CT Head WO showed bilateral frontal lobe masses. CT CAP showed pulmonary nodules, right breast mass, osseus metastasis in the ribs and spine. There was no evidence of disease in the abdomen/pelvis.  02/04/2021: establish with Dr. Lorenso Courier while inpatient 02/05/2021: breast mass biopsied, findings consistent with triple negative breast cancer.  2/15-2/25/2022: palliative radiation treatment to the brain  Interval History:  Tammy Larsen 70 y.o. female with medical history significant for metastatic triple negative breast cancer who presents for a follow up visit. The patient's last visit was on 06/17/2021. In the interim since the last visit Tammy Larsen has not has had considerable decline in her mental status and functional status.   On exam today Tammy Larsen reports she feels weak.  She is having hip pain on the right side for which she is requesting some Tylenol today.  She notes that her appetite has been okay but that she is having difficulty eating by herself.  Her niece who is present today notes that she has been helping her eat.  In private she has noticed decline as well as physical decline and believes it is time for transition to hospice.  Given that the patient has not made improvement with her functional status I do believe this is reasonable.  I do not believe that she would have to the point where she will be able to tolerate chemotherapy and therefore hospice referral was made today.  She denies any fevers, chills, sweats, nausea, vomiting or diarrhea.  A full 10 point ROS is listed below.  MEDICAL HISTORY:  Past Medical History:  Diagnosis Date   Arthritis     SURGICAL HISTORY: No past surgical history on  file.  SOCIAL HISTORY: Social History   Socioeconomic History   Marital status: Single    Spouse name: Not on file   Number of children: 0   Years of education: Not on file   Highest education level: Not on file  Occupational History   Not on file  Tobacco Use   Smoking status: Never   Smokeless tobacco: Never  Substance and Sexual Activity   Alcohol use: Never   Drug use: Not on file   Sexual activity: Not on file  Other Topics Concern   Not on file  Social History Narrative   Not on file   Social Determinants of Health   Financial Resource Strain: Not on file  Food Insecurity: Not on file  Transportation Needs: Not on file  Physical Activity: Not on file  Stress: Not on file  Social Connections: Not on file  Intimate Partner Violence: Not on file    FAMILY HISTORY: Family History  Problem Relation Age of Onset   Cancer Neg Hx     ALLERGIES:  is allergic to penicillins.  MEDICATIONS:  Current Outpatient Medications  Medication Sig Dispense Refill   acetaminophen (TYLENOL) 325 MG tablet Take 2 tablets (650 mg total) by mouth every 6 (six) hours as needed for mild pain.     calcium carbonate (OS-CAL - DOSED IN MG OF ELEMENTAL CALCIUM) 1250 (500 Ca) MG tablet Take 1 tablet by mouth daily with breakfast.     cholecalciferol (VITAMIN D3) 25 MCG (1000 UNIT) tablet Take 1,000 Units by mouth daily.     fexofenadine (ALLEGRA)  180 MG tablet Take 180 mg by mouth daily as needed for allergies or rhinitis.     Ginkgo Biloba 120 MG CAPS Take 120 mg by mouth daily.     levETIRAcetam (KEPPRA) 500 MG tablet Take 1 tablet (500 mg total) by mouth 2 (two) times daily. 60 tablet 0   methocarbamol (ROBAXIN) 500 MG tablet Take 500 mg by mouth every 8 (eight) hours as needed for muscle pain.     nitrofurantoin, macrocrystal-monohydrate, (MACROBID) 100 MG capsule Take 100 mg by mouth 2 (two) times daily.     NYAMYC powder Apply topically.     ondansetron (ZOFRAN) 4 MG tablet Take by  mouth.     polyethylene glycol (MIRALAX / GLYCOLAX) 17 g packet Take 17 g by mouth daily as needed for moderate constipation. 14 each 0   No current facility-administered medications for this visit.    REVIEW OF SYSTEMS:   Constitutional: ( - ) fevers, ( - )  chills , ( - ) night sweats Eyes: ( - ) blurriness of vision, ( - ) double vision, ( - ) watery eyes Ears, nose, mouth, throat, and face: ( - ) mucositis, ( - ) sore throat Respiratory: ( - ) cough, ( - ) dyspnea, ( - ) wheezes Cardiovascular: ( - ) palpitation, ( - ) chest discomfort, ( - ) lower extremity swelling Gastrointestinal:  ( - ) nausea, ( - ) heartburn, ( - ) change in bowel habits Skin: ( - ) abnormal skin rashes Lymphatics: ( - ) new lymphadenopathy, ( - ) easy bruising Neurological: ( - ) numbness, ( - ) tingling, ( - ) new weaknesses Behavioral/Psych: ( - ) mood change, ( - ) new changes  All other systems were reviewed with the patient and are negative.  PHYSICAL EXAMINATION: ECOG PERFORMANCE STATUS: 3 - Symptomatic, >50% confined to bed  Vitals:   08/19/21 1430  BP: 93/62  Pulse: 97  Resp: 17  Temp: (!) 96.3 F (35.7 C)  SpO2: 96%   Filed Weights     GENERAL: Chronically ill-appearing elderly African-American female in no acute distress, alert and comfortable SKIN: skin color, texture, turgor are normal, no rashes or significant lesions EYES: conjunctiva are pink and non-injected, sclera clear LUNGS: clear to auscultation and percussion with normal breathing effort HEART: regular rate & rhythm and no murmurs and no lower extremity edema Musculoskeletal: no cyanosis of digits and no clubbing  PSYCH: alert & oriented x 3, fluent speech NEURO: no focal motor/sensory deficits  LABORATORY DATA:  I have reviewed the data as listed CBC Latest Ref Rng & Units 08/19/2021 06/17/2021 04/30/2021  WBC 4.0 - 10.5 K/uL 8.4 6.5 8.0  Hemoglobin 12.0 - 15.0 g/dL 10.9(L) 11.2(L) 11.1(L)  Hematocrit 36.0 - 46.0 %  35.3(L) 35.9(L) 36.5  Platelets 150 - 400 K/uL 420(H) 334 266    CMP Latest Ref Rng & Units 08/19/2021 06/17/2021 04/30/2021  Glucose 70 - 99 mg/dL 100(H) 99 93  BUN 8 - 23 mg/dL '13 15 11  '$ Creatinine 0.44 - 1.00 mg/dL 0.68 0.62 0.70  Sodium 135 - 145 mmol/L 142 142 145  Potassium 3.5 - 5.1 mmol/L 3.4(L) 4.0 3.6  Chloride 98 - 111 mmol/L 107 106 107  CO2 22 - 32 mmol/L '25 27 28  '$ Calcium 8.9 - 10.3 mg/dL 9.6 10.1 9.3  Total Protein 6.5 - 8.1 g/dL 6.9 6.9 6.7  Total Bilirubin 0.3 - 1.2 mg/dL 0.4 0.3 0.3  Alkaline Phos 38 - 126 U/L 83  83 58  AST 15 - 41 U/L 18 16 12(L)  ALT 0 - 44 U/L 11 9 <6    RADIOGRAPHIC STUDIES: No results found.  ASSESSMENT & PLAN Tammy Larsen 70 y.o. female with medical history significant for metastatic triple negative breast cancer who presents for a follow up visit.  After reviewed labs, the records, discussed with the patient the findings most consistent with widely metastatic triple negative breast cancer.  Unfortunately patient has poor functional status with an ECOG approximately 3.  She is only able to stand with two-person assist and is badly deconditioned.  She is currently in a skilled nursing facility and is working with physical therapy.  She does hope she can receive treatment if it is all available to her.  Previously we discussed the nature of metastatic disease and is incurable nature.  We noted that all treatments moving forward would be palliative in nature designed to shrink or slow down the growth of the tumor, but would not remove the cancer from her body.  Surgery and radiation are not options at this point given his widespread to the brain, lungs, bones from the breast.  The patient and her niece voiced understanding of her diagnosis.  At this time I think she is a poor candidate for treatment given her marked deconditioning.  I would strongly recommend that she continue to pursue physical therapy and build up her strength and continue to eat  well.  If she were to have a change in functional status we could readdress and consider chemotherapy, however I am concerned that this may not be an option for her.  If the patient were to be well enough to consider systemic treatment we would consider monotherapy with either an anthracycline or taxane.  These drugs can be offered in combination, however they may increase response rate but do not improve overall survival and increase toxicity.  Given her already frail status I think monotherapy of 1 of these agents would be the most appropriate next step if treatment were feasible.  # Metastatic Triple Negative Breast Cancer --Unfortunately the patient has markedly poor functional status with an ECOG of 3.  She is not able to stand without 2 person assist and is badly deconditioned.  I do not believe at this point time she is a candidate for chemotherapy, but I do encourage her to continue with physical therapy in order to improve her strength. --Patient is status post palliative radiation to the brain. --We ordered additional tumor markers to see if there is a possible target for therapy, however these have returned without a clear target or new therapy option.  Unfortunately she is triple negative and only chemotherapy options available to her at this time. --Patient notes that she would like to pursue treatment if at all possible, but appears understanding about her poor functional status and the risks of chemotherapy.  --Referral made to hospice today due to worsening functional status. --Return to clinic in approximately 6 weeks time to reassess.  No orders of the defined types were placed in this encounter.  All questions were answered. The patient knows to call the clinic with any problems, questions or concerns.  A total of more than 30 minutes were spent on this encounter and over half of that time was spent on counseling and coordination of care as outlined above.   Ledell Peoples,  MD Department of Hematology/Oncology [place] at [facility] Phone: [phone] Pager: [phone] Email: [email or web address]  08/19/2021 4:21 PM

## 2021-09-23 ENCOUNTER — Telehealth: Payer: Self-pay | Admitting: Hematology and Oncology

## 2021-09-23 NOTE — Telephone Encounter (Signed)
Rescheduled 09/30 appointment to 10/05 per providers request, patient has been called and voicemail was left.

## 2021-09-24 ENCOUNTER — Other Ambulatory Visit: Payer: Medicare Other

## 2021-09-24 ENCOUNTER — Ambulatory Visit: Payer: Medicare Other | Admitting: Hematology and Oncology

## 2021-09-29 ENCOUNTER — Inpatient Hospital Stay: Payer: Medicaid Other | Admitting: Hematology and Oncology

## 2021-09-29 ENCOUNTER — Inpatient Hospital Stay: Payer: Medicaid Other | Attending: Hematology and Oncology

## 2021-09-29 ENCOUNTER — Other Ambulatory Visit: Payer: Self-pay | Admitting: Hematology and Oncology

## 2021-09-29 DIAGNOSIS — C7931 Secondary malignant neoplasm of brain: Secondary | ICD-10-CM

## 2021-09-29 DIAGNOSIS — C50911 Malignant neoplasm of unspecified site of right female breast: Secondary | ICD-10-CM

## 2021-12-26 DEATH — deceased

## 2022-03-07 IMAGING — MR MR HEAD WO/W CM
5 of 14 series · 13 of 48 positions shown · IV contrast (gadavist)
Comparison: None.

CLINICAL DATA: Metastatic disease follow-up

EXAM:
MRI HEAD WITHOUT AND WITH CONTRAST
TECHNIQUE: Multiplanar, multiecho pulse sequences of the brain and surrounding
structures were obtained without and with intravenous contrast.
CONTRAST:  7mL GADAVIST GADOBUTROL 1 MMOL/ML IV SOLN

[Series 3: DWI · axial · 3.0mm · 0.94mm/px · z∈[-41,+97]mm · 6 of 94 slices shown (1 of 2)]
[im 1/94]
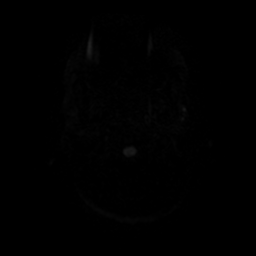
[im 19/94]
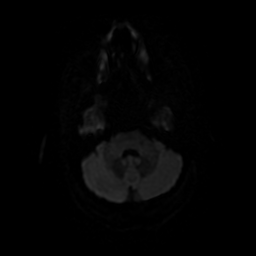
[im 38/94]
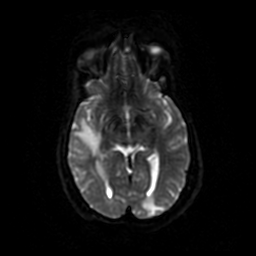
[im 56/94]
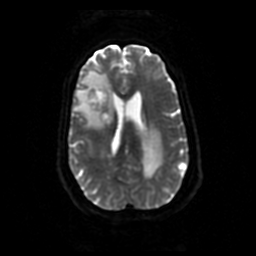
[im 75/94]
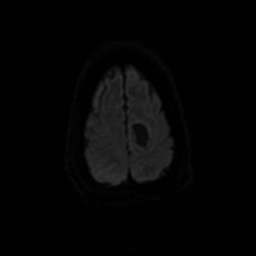
[im 94/94]
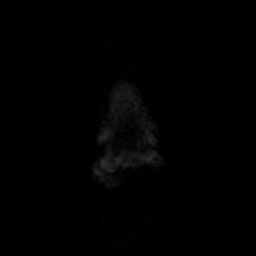

[Series 4: DWI · coronal · 4.0mm · 0.94mm/px · 1 of 72 slices shown (2 of 2)]
[im 1/72]
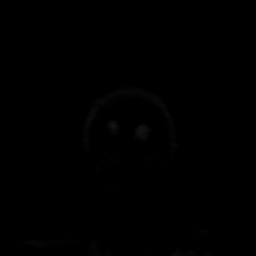

[Series 5: FLAIR · sagittal · 5.0mm · 0.23mm/px · 2 of 23 slices shown (1 of 2)]
[im 1/23]
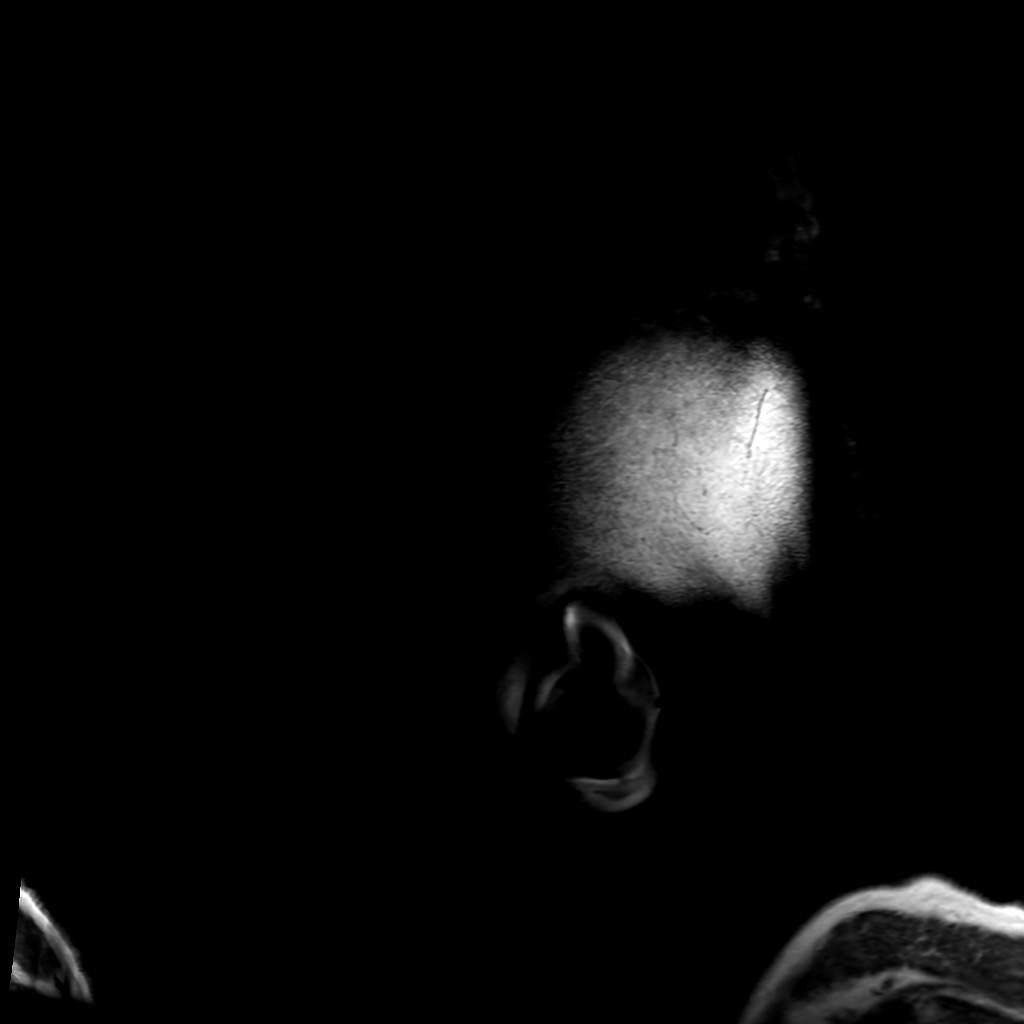
[im 23/23]
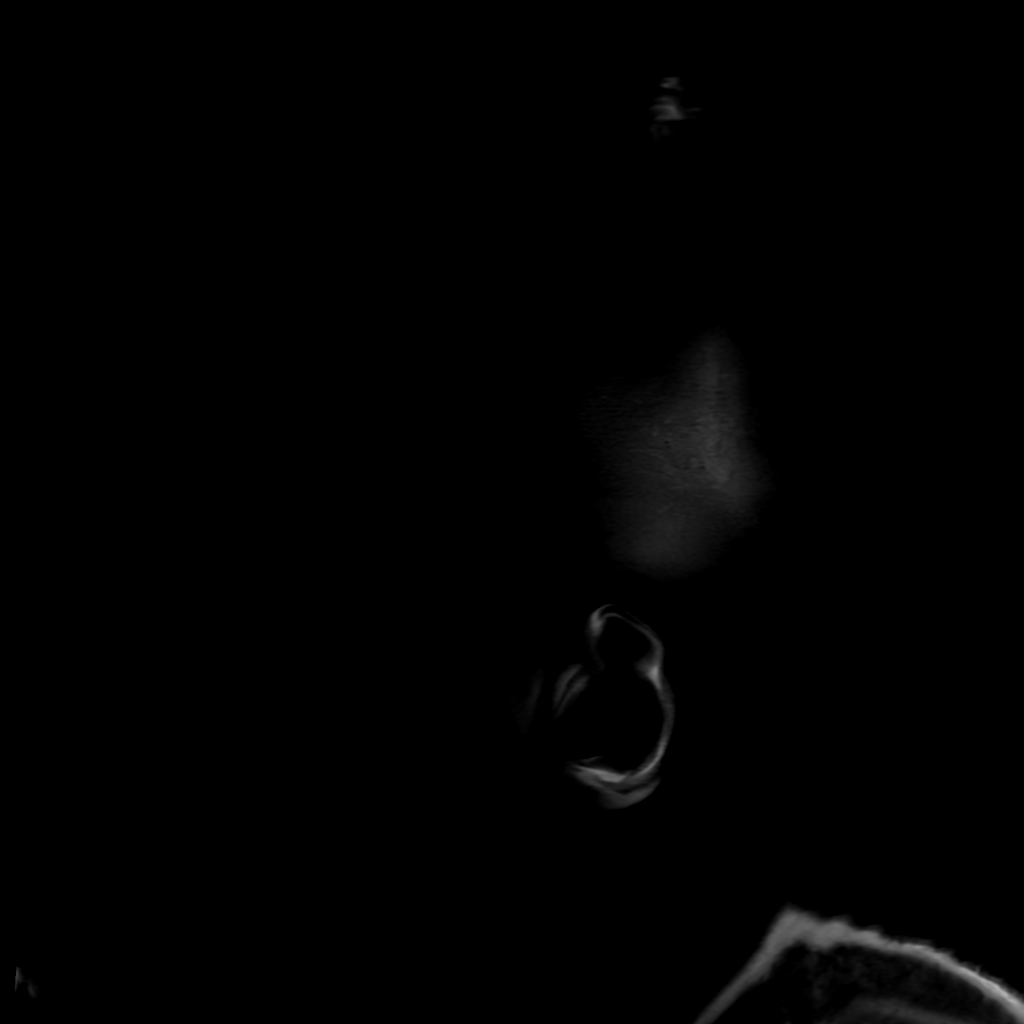

[Series 7: FLAIR · axial · 3.0mm · 0.45mm/px · z∈[-43,+95]mm · 2 of 24 slices shown (2 of 2)]
[im 1/24]
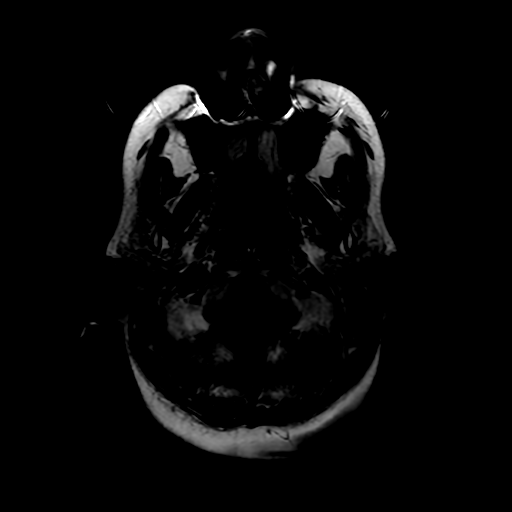
[im 24/24]
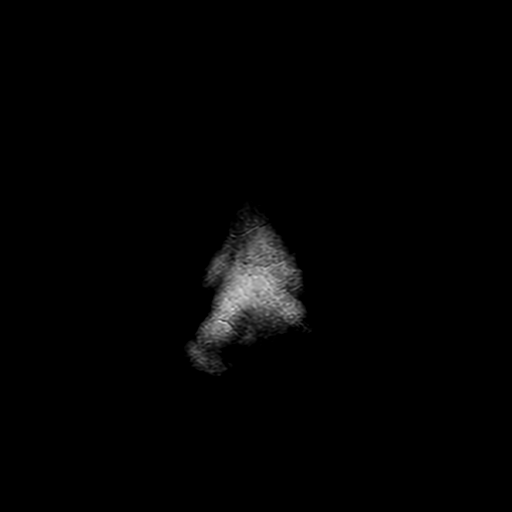

[Series 12: FLAIR post-contrast · sagittal · 5.0mm · 0.23mm/px · 2 of 23 slices shown]
[im 1/23]
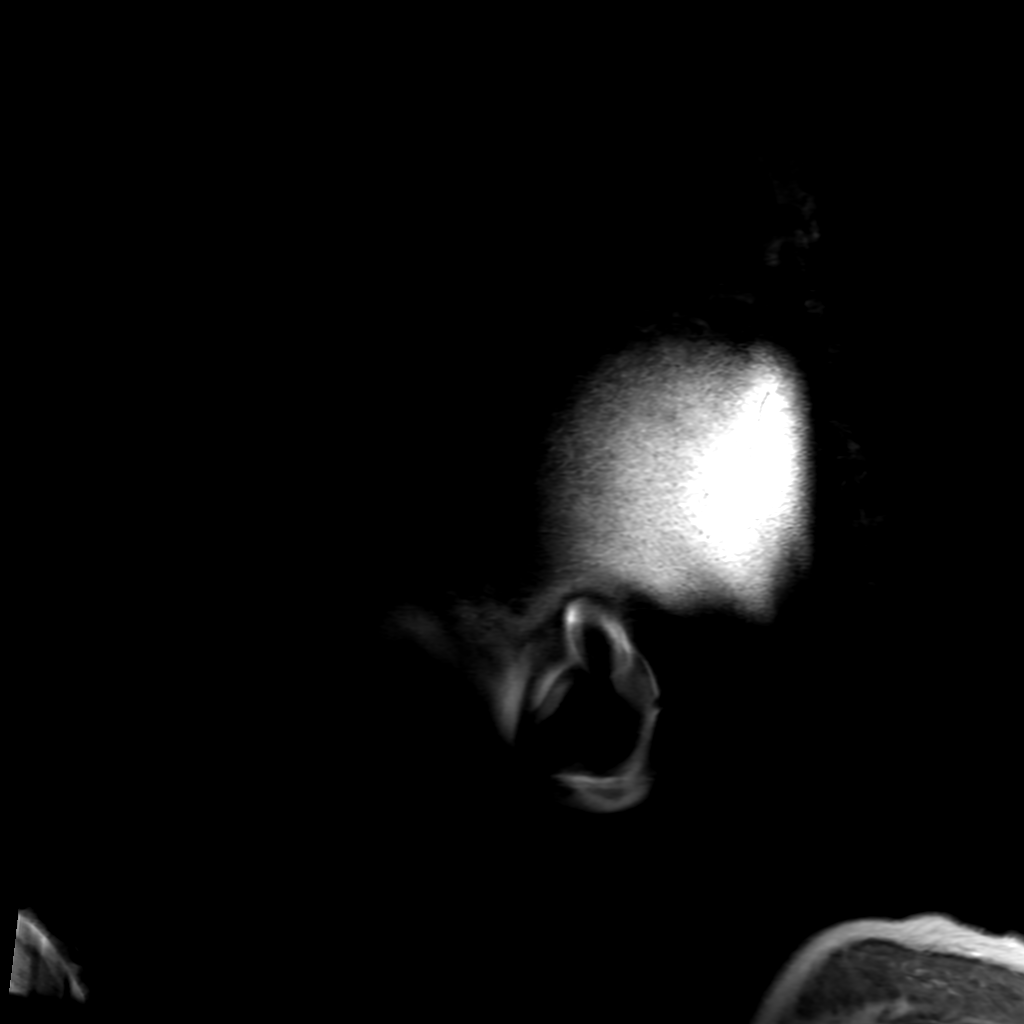
[im 23/23]
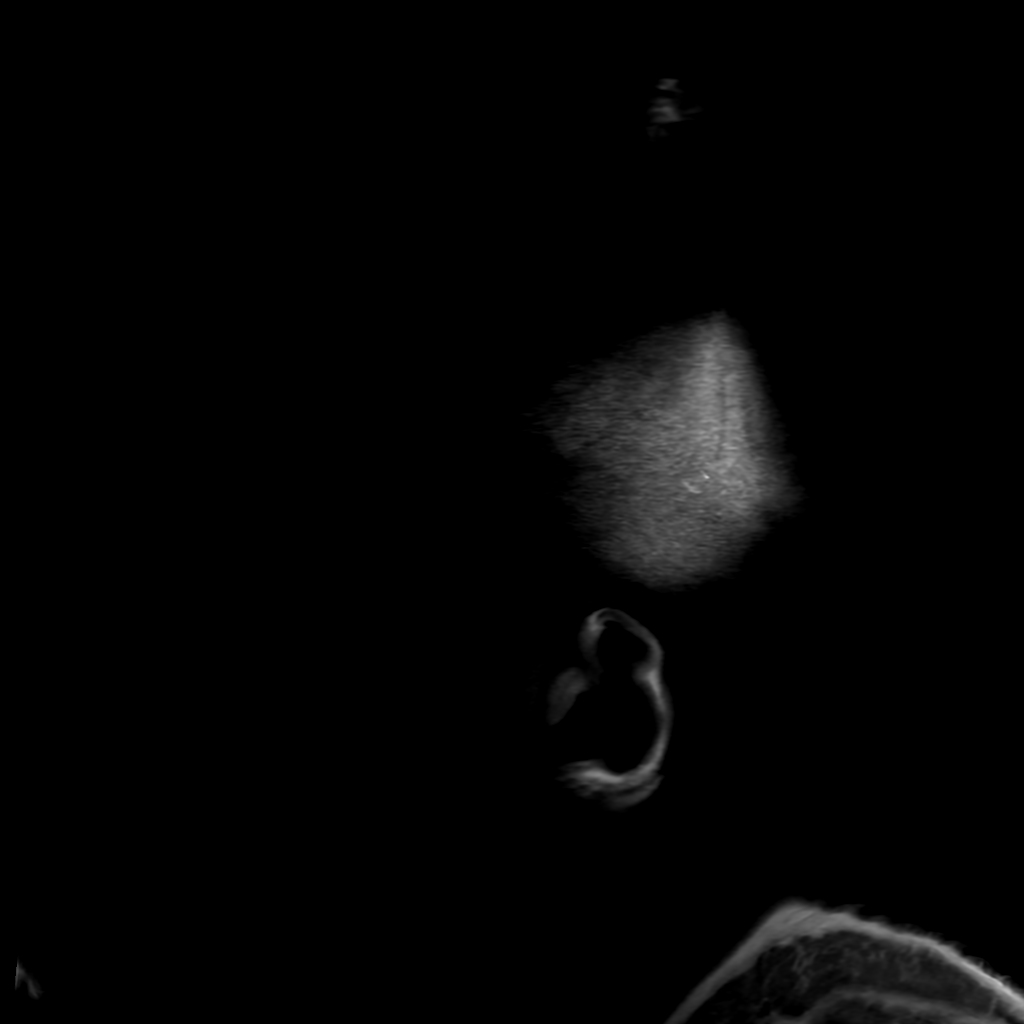

[13 of 48 positions shown; findings below may reference images not displayed]

FINDINGS: Brain: There are multiple intracranial metastatic lesions within
both hemispheres and in the right cerebellar hemisphere. There is a
small amount of associated with the right cerebellar lesion only. No
intrinsic hyperintense T1-weighted signal. There is moderate edema
surrounding most of the lesions, worst surrounding the lesions that
are numbered 11, 9, 4 and 5 below. Lesion sizes and locations:

1. Right cerebellum, 1.4 cm, series 11, image 9
2. Medial right temporal lobe 1.1 cm image 17
3. Inferior left frontal lobe 1.3 cm image 20
4. Right frontal operculum 1.4 cm image 24
5. Posterior right frontal operculum 1.5 cm image 24
6. Left occipital lobe 1.4 cm image 25
7. Left frontal lobe 0.6 cm image 23
8. Lateral left parietal lobe 1.4 cm image 28
9. Right frontal white matter 2.2 cm image 27
10. Right frontal periventricular white matter 1.1 cm image 28
11. Paramedian left frontal lobe 3.2 cm image 35
12. Right parietal lobe 0.8 cm image 33
13. Lateral right parietal lobe 2.0 cm image 37
14. Posteroinferior left lentiform nucleus 0.3 cm image 18

There is no midline shift, hydrocephalus or significant mass effect.

Vascular: Normal flow voids.

Skull and upper cervical spine: Normal marrow signal.

Sinuses/Orbits: Negative.

Other: None.
IMPRESSION: Multiple intracranial metastatic lesions, the largest of which
measures up to 3.2 cm. Moderate multifocal edema without midline
shift or significant mass effect.
